# Patient Record
Sex: Female | Born: 1955
Health system: Southern US, Community
[De-identification: ages and names within clinical notes are randomized; demographics above are authoritative.]

## PROBLEM LIST (undated history)

## (undated) DIAGNOSIS — E785 Hyperlipidemia, unspecified: Secondary | ICD-10-CM

## (undated) DIAGNOSIS — I10 Essential (primary) hypertension: Secondary | ICD-10-CM

## (undated) DIAGNOSIS — E119 Type 2 diabetes mellitus without complications: Secondary | ICD-10-CM

## (undated) DIAGNOSIS — G43909 Migraine, unspecified, not intractable, without status migrainosus: Secondary | ICD-10-CM

## (undated) DIAGNOSIS — D649 Anemia, unspecified: Secondary | ICD-10-CM

## (undated) HISTORY — DX: Anemia, unspecified: D64.9

## (undated) HISTORY — DX: Essential (primary) hypertension: I10

## (undated) HISTORY — DX: Hyperlipidemia, unspecified: E78.5

## (undated) HISTORY — DX: Type 2 diabetes mellitus without complications: E11.9

## (undated) HISTORY — DX: Migraine, unspecified, not intractable, without status migrainosus: G43.909

## (undated) HISTORY — PX: OTHER SURGICAL HISTORY: SHX169

---

## 1998-10-30 ENCOUNTER — Ambulatory Visit (HOSPITAL_COMMUNITY): Admission: RE | Admit: 1998-10-30 | Discharge: 1998-10-30 | Payer: Self-pay | Admitting: Obstetrics and Gynecology

## 1998-10-30 ENCOUNTER — Encounter: Payer: Self-pay | Admitting: Obstetrics and Gynecology

## 1999-11-03 ENCOUNTER — Other Ambulatory Visit: Admission: RE | Admit: 1999-11-03 | Discharge: 1999-11-03 | Payer: Self-pay | Admitting: Obstetrics & Gynecology

## 1999-11-13 ENCOUNTER — Encounter: Payer: Self-pay | Admitting: Obstetrics and Gynecology

## 1999-11-13 ENCOUNTER — Ambulatory Visit (HOSPITAL_COMMUNITY): Admission: RE | Admit: 1999-11-13 | Discharge: 1999-11-13 | Payer: Self-pay | Admitting: Obstetrics and Gynecology

## 2001-05-23 ENCOUNTER — Encounter: Payer: Self-pay | Admitting: Obstetrics and Gynecology

## 2001-05-23 ENCOUNTER — Ambulatory Visit (HOSPITAL_COMMUNITY): Admission: RE | Admit: 2001-05-23 | Discharge: 2001-05-23 | Payer: Self-pay | Admitting: Obstetrics and Gynecology

## 2001-06-28 ENCOUNTER — Other Ambulatory Visit: Admission: RE | Admit: 2001-06-28 | Discharge: 2001-06-28 | Payer: Self-pay | Admitting: Obstetrics and Gynecology

## 2002-07-11 ENCOUNTER — Other Ambulatory Visit: Admission: RE | Admit: 2002-07-11 | Discharge: 2002-07-11 | Payer: Self-pay | Admitting: Obstetrics and Gynecology

## 2002-07-20 ENCOUNTER — Ambulatory Visit (HOSPITAL_COMMUNITY): Admission: RE | Admit: 2002-07-20 | Discharge: 2002-07-20 | Payer: Self-pay | Admitting: Obstetrics and Gynecology

## 2002-07-20 ENCOUNTER — Encounter: Payer: Self-pay | Admitting: Obstetrics and Gynecology

## 2003-03-13 ENCOUNTER — Encounter: Admission: RE | Admit: 2003-03-13 | Discharge: 2003-03-13 | Payer: Self-pay | Admitting: Internal Medicine

## 2003-03-13 ENCOUNTER — Encounter: Payer: Self-pay | Admitting: Internal Medicine

## 2003-04-29 ENCOUNTER — Ambulatory Visit (HOSPITAL_COMMUNITY): Admission: RE | Admit: 2003-04-29 | Discharge: 2003-04-29 | Payer: Self-pay | Admitting: Gastroenterology

## 2003-07-23 ENCOUNTER — Other Ambulatory Visit: Admission: RE | Admit: 2003-07-23 | Discharge: 2003-07-23 | Payer: Self-pay | Admitting: Obstetrics and Gynecology

## 2003-07-30 ENCOUNTER — Encounter: Payer: Self-pay | Admitting: Obstetrics and Gynecology

## 2003-07-30 ENCOUNTER — Ambulatory Visit (HOSPITAL_COMMUNITY): Admission: RE | Admit: 2003-07-30 | Discharge: 2003-07-30 | Payer: Self-pay | Admitting: Obstetrics and Gynecology

## 2004-07-31 ENCOUNTER — Other Ambulatory Visit: Admission: RE | Admit: 2004-07-31 | Discharge: 2004-07-31 | Payer: Self-pay | Admitting: Obstetrics and Gynecology

## 2004-07-31 ENCOUNTER — Ambulatory Visit (HOSPITAL_COMMUNITY): Admission: RE | Admit: 2004-07-31 | Discharge: 2004-07-31 | Payer: Self-pay | Admitting: Obstetrics and Gynecology

## 2005-02-19 ENCOUNTER — Ambulatory Visit: Payer: Self-pay | Admitting: Licensed Clinical Social Worker

## 2005-02-26 ENCOUNTER — Ambulatory Visit: Payer: Self-pay | Admitting: Licensed Clinical Social Worker

## 2005-03-05 ENCOUNTER — Ambulatory Visit: Payer: Self-pay | Admitting: Licensed Clinical Social Worker

## 2005-03-19 ENCOUNTER — Ambulatory Visit: Payer: Self-pay | Admitting: Licensed Clinical Social Worker

## 2005-04-09 ENCOUNTER — Ambulatory Visit: Payer: Self-pay | Admitting: Licensed Clinical Social Worker

## 2005-04-30 ENCOUNTER — Ambulatory Visit: Payer: Self-pay | Admitting: Licensed Clinical Social Worker

## 2005-06-11 ENCOUNTER — Ambulatory Visit: Payer: Self-pay | Admitting: Licensed Clinical Social Worker

## 2005-08-03 ENCOUNTER — Ambulatory Visit (HOSPITAL_COMMUNITY): Admission: RE | Admit: 2005-08-03 | Discharge: 2005-08-03 | Payer: Self-pay | Admitting: Obstetrics and Gynecology

## 2005-08-12 ENCOUNTER — Other Ambulatory Visit: Admission: RE | Admit: 2005-08-12 | Discharge: 2005-08-12 | Payer: Self-pay | Admitting: Obstetrics and Gynecology

## 2006-08-05 ENCOUNTER — Ambulatory Visit (HOSPITAL_COMMUNITY): Admission: RE | Admit: 2006-08-05 | Discharge: 2006-08-05 | Payer: Self-pay | Admitting: Obstetrics and Gynecology

## 2006-10-05 ENCOUNTER — Other Ambulatory Visit: Admission: RE | Admit: 2006-10-05 | Discharge: 2006-10-05 | Payer: Self-pay | Admitting: Obstetrics and Gynecology

## 2007-07-07 ENCOUNTER — Emergency Department (HOSPITAL_COMMUNITY): Admission: EM | Admit: 2007-07-07 | Discharge: 2007-07-07 | Payer: Self-pay | Admitting: Family Medicine

## 2007-08-10 ENCOUNTER — Ambulatory Visit (HOSPITAL_COMMUNITY): Admission: RE | Admit: 2007-08-10 | Discharge: 2007-08-10 | Payer: Self-pay | Admitting: Obstetrics and Gynecology

## 2007-08-16 ENCOUNTER — Encounter: Admission: RE | Admit: 2007-08-16 | Discharge: 2007-08-16 | Payer: Self-pay | Admitting: Obstetrics and Gynecology

## 2007-12-22 ENCOUNTER — Encounter: Admission: RE | Admit: 2007-12-22 | Discharge: 2007-12-22 | Payer: Self-pay | Admitting: Internal Medicine

## 2008-08-20 ENCOUNTER — Ambulatory Visit (HOSPITAL_COMMUNITY): Admission: RE | Admit: 2008-08-20 | Discharge: 2008-08-20 | Payer: Self-pay | Admitting: Obstetrics and Gynecology

## 2008-09-18 ENCOUNTER — Ambulatory Visit: Payer: Self-pay | Admitting: Family Medicine

## 2008-09-18 ENCOUNTER — Other Ambulatory Visit: Admission: RE | Admit: 2008-09-18 | Discharge: 2008-09-18 | Payer: Self-pay | Admitting: Family Medicine

## 2008-09-18 ENCOUNTER — Encounter: Payer: Self-pay | Admitting: Family Medicine

## 2008-09-18 DIAGNOSIS — I10 Essential (primary) hypertension: Secondary | ICD-10-CM | POA: Insufficient documentation

## 2008-09-19 ENCOUNTER — Telehealth (INDEPENDENT_AMBULATORY_CARE_PROVIDER_SITE_OTHER): Payer: Self-pay | Admitting: *Deleted

## 2008-09-19 LAB — CONVERTED CEMR LAB
ALT: 31 units/L (ref 0–35)
AST: 29 units/L (ref 0–37)
Albumin: 4 g/dL (ref 3.5–5.2)
Alkaline Phosphatase: 58 units/L (ref 39–117)
BUN: 12 mg/dL (ref 6–23)
Basophils Absolute: 0 10*3/uL (ref 0.0–0.1)
Basophils Relative: 0.7 % (ref 0.0–3.0)
Chloride: 100 meq/L (ref 96–112)
Cholesterol: 270 mg/dL (ref 0–200)
Creatinine, Ser: 0.7 mg/dL (ref 0.4–1.2)
Direct LDL: 136.5 mg/dL
Glucose, Bld: 82 mg/dL (ref 70–99)
MCV: 66.9 fL — ABNORMAL LOW (ref 78.0–100.0)
Monocytes Absolute: 0.6 10*3/uL (ref 0.1–1.0)
Total Bilirubin: 0.7 mg/dL (ref 0.3–1.2)
Triglycerides: 312 mg/dL (ref 0–149)

## 2008-10-17 ENCOUNTER — Ambulatory Visit: Payer: Self-pay | Admitting: Family Medicine

## 2008-10-18 ENCOUNTER — Encounter (INDEPENDENT_AMBULATORY_CARE_PROVIDER_SITE_OTHER): Payer: Self-pay | Admitting: *Deleted

## 2008-10-18 LAB — CONVERTED CEMR LAB
ALT: 32 units/L (ref 0–35)
AST: 28 units/L (ref 0–37)
Alkaline Phosphatase: 58 units/L (ref 39–117)
Bilirubin, Direct: 0.1 mg/dL (ref 0.0–0.3)
Total Bilirubin: 0.6 mg/dL (ref 0.3–1.2)

## 2008-12-19 ENCOUNTER — Ambulatory Visit: Payer: Self-pay | Admitting: Family Medicine

## 2009-01-15 ENCOUNTER — Telehealth (INDEPENDENT_AMBULATORY_CARE_PROVIDER_SITE_OTHER): Payer: Self-pay | Admitting: *Deleted

## 2009-06-24 ENCOUNTER — Ambulatory Visit: Payer: Self-pay | Admitting: Family Medicine

## 2009-06-24 DIAGNOSIS — E785 Hyperlipidemia, unspecified: Secondary | ICD-10-CM | POA: Insufficient documentation

## 2009-06-26 ENCOUNTER — Telehealth (INDEPENDENT_AMBULATORY_CARE_PROVIDER_SITE_OTHER): Payer: Self-pay | Admitting: *Deleted

## 2009-06-26 LAB — CONVERTED CEMR LAB
BUN: 14 mg/dL (ref 6–23)
CO2: 30 meq/L (ref 19–32)
HDL: 48.1 mg/dL (ref >39.00)
Potassium: 3.2 meq/L — ABNORMAL LOW (ref 3.5–5.1)
Total Bilirubin: 0.6 mg/dL (ref 0.3–1.2)
Total CHOL/HDL Ratio: 5

## 2009-06-27 ENCOUNTER — Ambulatory Visit: Payer: Self-pay | Admitting: Family Medicine

## 2009-06-27 DIAGNOSIS — F329 Major depressive disorder, single episode, unspecified: Secondary | ICD-10-CM

## 2009-06-27 DIAGNOSIS — F3289 Other specified depressive episodes: Secondary | ICD-10-CM | POA: Insufficient documentation

## 2009-07-29 ENCOUNTER — Ambulatory Visit: Payer: Self-pay | Admitting: Family Medicine

## 2009-08-05 ENCOUNTER — Encounter: Payer: Self-pay | Admitting: Family Medicine

## 2009-08-12 ENCOUNTER — Ambulatory Visit: Payer: Self-pay | Admitting: Internal Medicine

## 2009-08-12 ENCOUNTER — Ambulatory Visit: Payer: Self-pay | Admitting: Family Medicine

## 2009-08-12 DIAGNOSIS — E876 Hypokalemia: Secondary | ICD-10-CM | POA: Insufficient documentation

## 2009-08-13 LAB — CONVERTED CEMR LAB
CO2: 31 meq/L (ref 19–32)
Calcium: 9.7 mg/dL (ref 8.4–10.5)
Creatinine, Ser: 0.8 mg/dL (ref 0.4–1.2)
GFR calc non Af Amer: 79.48 mL/min (ref >60)
Glucose, Bld: 125 mg/dL — ABNORMAL HIGH (ref 70–99)

## 2009-08-14 ENCOUNTER — Telehealth (INDEPENDENT_AMBULATORY_CARE_PROVIDER_SITE_OTHER): Payer: Self-pay | Admitting: *Deleted

## 2009-08-20 ENCOUNTER — Ambulatory Visit: Payer: Self-pay | Admitting: Family Medicine

## 2009-08-20 DIAGNOSIS — J309 Allergic rhinitis, unspecified: Secondary | ICD-10-CM | POA: Insufficient documentation

## 2009-08-26 ENCOUNTER — Ambulatory Visit (HOSPITAL_COMMUNITY): Admission: RE | Admit: 2009-08-26 | Discharge: 2009-08-26 | Payer: Self-pay | Admitting: Family Medicine

## 2009-09-16 ENCOUNTER — Ambulatory Visit: Payer: Self-pay | Admitting: Family Medicine

## 2009-09-24 LAB — CONVERTED CEMR LAB: Pap Smear: NORMAL

## 2009-10-03 ENCOUNTER — Ambulatory Visit: Payer: Self-pay

## 2009-10-03 ENCOUNTER — Ambulatory Visit: Payer: Self-pay | Admitting: Family Medicine

## 2009-10-03 DIAGNOSIS — I8000 Phlebitis and thrombophlebitis of superficial vessels of unspecified lower extremity: Secondary | ICD-10-CM | POA: Insufficient documentation

## 2009-10-03 DIAGNOSIS — R609 Edema, unspecified: Secondary | ICD-10-CM | POA: Insufficient documentation

## 2009-11-26 ENCOUNTER — Encounter: Payer: Self-pay | Admitting: Family Medicine

## 2009-11-26 ENCOUNTER — Ambulatory Visit: Payer: Self-pay | Admitting: Family Medicine

## 2009-11-26 ENCOUNTER — Other Ambulatory Visit: Admission: RE | Admit: 2009-11-26 | Discharge: 2009-11-26 | Payer: Self-pay | Admitting: Family Medicine

## 2009-11-28 ENCOUNTER — Telehealth: Payer: Self-pay | Admitting: Family Medicine

## 2009-11-28 LAB — CONVERTED CEMR LAB
Albumin: 4.1 g/dL (ref 3.5–5.2)
Bilirubin, Direct: 0 mg/dL (ref 0.0–0.3)
CO2: 33 meq/L — ABNORMAL HIGH (ref 19–32)
Calcium: 9.7 mg/dL (ref 8.4–10.5)
Chloride: 103 meq/L (ref 96–112)
Cholesterol: 268 mg/dL — ABNORMAL HIGH (ref 0–200)
Eosinophils Absolute: 0.1 10*3/uL (ref 0.0–0.7)
GFR calc non Af Amer: 79.39 mL/min (ref >60)
Glucose, Bld: 84 mg/dL (ref 70–99)
HCT: 38 % (ref 36.0–46.0)
HDL: 54.4 mg/dL (ref >39.00)
Hemoglobin: 12.4 g/dL (ref 12.0–15.0)
Lymphocytes Relative: 22.5 % (ref 12.0–46.0)
Lymphs Abs: 1.3 10*3/uL (ref 0.7–4.0)
MCHC: 32.6 g/dL (ref 30.0–36.0)
MCV: 66.9 fL — ABNORMAL LOW (ref 78.0–100.0)
Neutro Abs: 3.9 10*3/uL (ref 1.4–7.7)
Platelets: 245 10*3/uL (ref 150.0–400.0)
Sodium: 142 meq/L (ref 135–145)
Total CHOL/HDL Ratio: 5

## 2009-12-04 ENCOUNTER — Ambulatory Visit: Payer: Self-pay | Admitting: Family Medicine

## 2009-12-04 ENCOUNTER — Encounter (INDEPENDENT_AMBULATORY_CARE_PROVIDER_SITE_OTHER): Payer: Self-pay | Admitting: *Deleted

## 2009-12-04 LAB — CONVERTED CEMR LAB: OCCULT 2: NEGATIVE

## 2009-12-30 ENCOUNTER — Telehealth (INDEPENDENT_AMBULATORY_CARE_PROVIDER_SITE_OTHER): Payer: Self-pay | Admitting: *Deleted

## 2010-04-08 ENCOUNTER — Ambulatory Visit: Payer: Self-pay | Admitting: Family Medicine

## 2010-04-15 ENCOUNTER — Ambulatory Visit: Payer: Self-pay | Admitting: Family Medicine

## 2010-04-16 ENCOUNTER — Telehealth (INDEPENDENT_AMBULATORY_CARE_PROVIDER_SITE_OTHER): Payer: Self-pay | Admitting: *Deleted

## 2010-04-16 LAB — CONVERTED CEMR LAB
AST: 21 units/L (ref 0–37)
Albumin: 4.2 g/dL (ref 3.5–5.2)
CO2: 32 meq/L (ref 19–32)
Cholesterol: 287 mg/dL — ABNORMAL HIGH (ref 0–200)
HDL: 46.5 mg/dL (ref >39.00)
Potassium: 4.3 meq/L (ref 3.5–5.1)
Total Bilirubin: 0.6 mg/dL (ref 0.3–1.2)
Total CHOL/HDL Ratio: 6
Triglycerides: 333 mg/dL — ABNORMAL HIGH (ref 0.0–149.0)
VLDL: 66.6 mg/dL — ABNORMAL HIGH (ref 0.0–40.0)

## 2010-05-04 ENCOUNTER — Ambulatory Visit: Payer: Self-pay | Admitting: Family Medicine

## 2010-08-25 ENCOUNTER — Ambulatory Visit: Payer: Self-pay | Admitting: Family Medicine

## 2010-08-25 LAB — CONVERTED CEMR LAB: Rapid Strep: NEGATIVE

## 2010-08-27 ENCOUNTER — Ambulatory Visit (HOSPITAL_COMMUNITY): Admission: RE | Admit: 2010-08-27 | Discharge: 2010-08-27 | Payer: Self-pay | Admitting: Family Medicine

## 2010-11-27 ENCOUNTER — Ambulatory Visit: Payer: Self-pay | Admitting: Family Medicine

## 2010-11-27 ENCOUNTER — Encounter: Payer: Self-pay | Admitting: Family Medicine

## 2010-11-27 ENCOUNTER — Other Ambulatory Visit
Admission: RE | Admit: 2010-11-27 | Discharge: 2010-11-27 | Payer: Self-pay | Source: Home / Self Care | Admitting: Family Medicine

## 2010-11-27 LAB — HM PAP SMEAR: HM Pap smear: NORMAL

## 2010-11-30 ENCOUNTER — Telehealth (INDEPENDENT_AMBULATORY_CARE_PROVIDER_SITE_OTHER): Payer: Self-pay | Admitting: *Deleted

## 2010-11-30 LAB — CONVERTED CEMR LAB
Alkaline Phosphatase: 89 units/L (ref 39–117)
BUN: 11 mg/dL (ref 6–23)
Calcium: 9.6 mg/dL (ref 8.4–10.5)
Chloride: 101 meq/L (ref 96–112)
Creatinine, Ser: 0.8 mg/dL (ref 0.4–1.2)
Direct LDL: 130.1 mg/dL
Eosinophils Absolute: 0.1 10*3/uL (ref 0.0–0.7)
Eosinophils Relative: 1.3 % (ref 0.0–5.0)
Glucose, Bld: 93 mg/dL (ref 70–99)
HDL: 55 mg/dL (ref >39.00)
Hemoglobin: 12.3 g/dL (ref 12.0–15.0)
Lymphocytes Relative: 22.5 % (ref 12.0–46.0)
Lymphs Abs: 1.2 10*3/uL (ref 0.7–4.0)
MCHC: 31.6 g/dL (ref 30.0–36.0)
MCV: 67.4 fL — ABNORMAL LOW (ref 78.0–100.0)
Monocytes Absolute: 0.4 10*3/uL (ref 0.1–1.0)
Monocytes Relative: 8 % (ref 3.0–12.0)
Neutro Abs: 3.6 10*3/uL (ref 1.4–7.7)
Platelets: 283 10*3/uL (ref 150.0–400.0)
Potassium: 4.2 meq/L (ref 3.5–5.1)
Total Bilirubin: 0.8 mg/dL (ref 0.3–1.2)
Total CHOL/HDL Ratio: 5
Triglycerides: 505 mg/dL — ABNORMAL HIGH (ref 0.0–149.0)
WBC: 5.3 10*3/uL (ref 4.5–10.5)

## 2011-01-12 NOTE — Progress Notes (Signed)
Summary: labs  Phone Note Outgoing Call   Call placed by: Doristine Devoid,  Apr 16, 2010 9:48 AM Call placed to: Patient Summary of Call: total cholesterol and LDL worse than previous.  pt is intolerant to statins- needs to start Welchol 3 tabs by mouth two times a day.  should drink full glass of water with meds.  may cause constipation- should start stool softener.  this medicine is not absorbed and does not cause muscle aches.  please provide pt samples.  needs f/u OV in 1 month.  should also focus on healthy diet and regular exercise.  rest of labs look good.  Follow-up for Phone Call        left message on machine .......Marland KitchenDoristine Devoid  Apr 16, 2010 9:49 AM   left message on machine .....Marland KitchenMarland KitchenDoristine Devoid  Apr 17, 2010 4:15 PM   spoke w/ patient aware of labs and that medication needs to be started due to excessively high cholesterol provide samples and copy of labs...Marland KitchenMarland KitchenDoristine Devoid  Apr 17, 2010 4:24 PM     New/Updated Medications: WELCHOL 625 MG TABS (COLESEVELAM HCL) take 3 tablets two times a day

## 2011-01-12 NOTE — Progress Notes (Signed)
Summary: Labs mailed to her  Phone Note Call from Patient   Summary of Call: Pt called and would like a copy of her labs mailed to her. I mailed her a copy. Army Fossa CMA  December 30, 2009 10:56 AM

## 2011-01-12 NOTE — Assessment & Plan Note (Signed)
Summary: 3WEEK FOLLOW UP/RH.....   Vital Signs:  Patient profile:   55 year old female Weight:      138 pounds Pulse rate:   67 / minute BP sitting:   124 / 90  (left arm)  Vitals Entered By: Doristine Devoid (May 04, 2010 4:13 PM) CC: 3 week f/u on BP   History of Present Illness: 55 yo woman here today for f/u on  1) BP- no CP, SOB, HAs, visual changes, edema.  tolerating meds w/out difficulty  2) Hyperlipidemia- doing well on Welchol.  + constipation.  eating fiber and using stool softener which is helping w/ constipation.  Current Medications (verified): 1)  Toprol Xl 50 Mg Xr24h-Tab (Metoprolol Succinate) .... Take One Tablet Daily 2)  Aspirin 81 Mg Tbec (Aspirin) .... Take One Tablet Daily 3)  Norvasc 5 Mg Tabs (Amlodipine Besylate) .Marland Kitchen.. 1 By Mouth Once Daily 4)  Hydrochlorothiazide 12.5 Mg  Tabs (Hydrochlorothiazide) .... Take 1 Tab  By Mouth Every Morning 5)  Welchol 625 Mg Tabs (Colesevelam Hcl) .... Take 3 Tablets Two Times A Day  Allergies (verified): 1)  ! * Statins  Review of Systems      See HPI  Physical Exam  General:  Well-developed,well-nourished,in no acute distress; alert,appropriate and cooperative throughout examination Neck:  No deformities, masses, or tenderness noted. Lungs:  Normal respiratory effort, chest expands symmetrically. Lungs are clear to auscultation, no crackles or wheezes. Heart:  normal rate and no murmur.   Pulses:  +2 carotid, radial, DP pulses Extremities:  No clubbing, cyanosis, edema, or deformity noted.     Impression & Recommendations:  Problem # 1:  HYPERTENSION, BENIGN ESSENTIAL (ICD-401.1) Assessment Unchanged BP adequately controlled.  asymptomatic.  no changes. Her updated medication list for this problem includes:    Toprol Xl 50 Mg Xr24h-tab (Metoprolol succinate) .Marland Kitchen... Take one tablet daily    Norvasc 5 Mg Tabs (Amlodipine besylate) .Marland Kitchen... 1 by mouth once daily    Hydrochlorothiazide 12.5 Mg Tabs  (Hydrochlorothiazide) .Marland Kitchen... Take 1 tab  by mouth every morning  Problem # 2:  HYPERLIPIDEMIA (ICD-272.4) Assessment: Unchanged tolerating welchol w/out muscle aches of a statin.  continue. Her updated medication list for this problem includes:    Welchol 625 Mg Tabs (Colesevelam hcl) .Marland Kitchen... Take 3 tablets two times a day  Complete Medication List: 1)  Toprol Xl 50 Mg Xr24h-tab (Metoprolol succinate) .... Take one tablet daily 2)  Aspirin 81 Mg Tbec (Aspirin) .... Take one tablet daily 3)  Norvasc 5 Mg Tabs (Amlodipine besylate) .Marland Kitchen.. 1 by mouth once daily 4)  Hydrochlorothiazide 12.5 Mg Tabs (Hydrochlorothiazide) .... Take 1 tab  by mouth every morning 5)  Welchol 625 Mg Tabs (Colesevelam hcl) .... Take 3 tablets two times a day  Patient Instructions: 1)  Please schedule your physical after 12/15- do not eat before this appt 2)  Continue your meds as directed- they are working! 3)  Call for refills 4)  Have a great summer!!! Prescriptions: WELCHOL 625 MG TABS (COLESEVELAM HCL) take 3 tablets two times a day  #180 x 3   Entered and Authorized by:   Neena Rhymes MD   Signed by:   Neena Rhymes MD on 05/04/2010   Method used:   [facility]        [facility] [phone][facility] (retail)       [address]       [place], [place]  [address]  Ph: [phone]       Fax: [phone]   RxID:   [id number]

## 2011-01-12 NOTE — Assessment & Plan Note (Signed)
Summary: sore throat/cbs   Vital Signs:  Patient profile:   55 year old female Height:      58.75 inches Weight:      141 pounds O2 Sat:      99 % on Room air Temp:     97.9 degrees F oral Pulse rate:   79 / minute BP sitting:   120 / 80  (left arm)  Vitals Entered By: Jeremy Johann CMA (August 25, 2010 11:53 AM)  O2 Flow:  Room air CC: sore throat, dry cough x1d   History of Present Illness: 55 yo woman here today for sore throat.  sxs started yesterday.  had temp of 99.5 at work earlier today.  + fatigue.  + L ear pain.  no nasal congestion.  + cough w/ talking, 'it's irritated'.  no facial pain or pressure.  + sick contacts.  Current Medications (verified): 1)  Toprol Xl 50 Mg Xr24h-Tab (Metoprolol Succinate) .... Take One Tablet Daily 2)  Aspirin 81 Mg Tbec (Aspirin) .... Take One Tablet Daily 3)  Norvasc 5 Mg Tabs (Amlodipine Besylate) .Marland Kitchen.. 1 By Mouth Once Daily 4)  Hydrochlorothiazide 12.5 Mg  Tabs (Hydrochlorothiazide) .... Take 1 Tab  By Mouth Every Morning 5)  Welchol 625 Mg Tabs (Colesevelam Hcl) .... Take 3 Tablets Two Times A Day  Allergies (verified): 1)  ! * Statins  Review of Systems      See HPI  Physical Exam  General:  Well-developed,well-nourished,in no acute distress; alert,appropriate and cooperative throughout examination Head:  Normocephalic and atraumatic without obvious abnormalities. No apparent alopecia or balding.  no TTP over sinuses Eyes:  no injxn or inflammation Ears:  L TM erythematous, dull, poor landmarks R TM WNL Nose:  mild congestion and turbinate edema Mouth:  + PND and pharyngeal erythema.  no tonsilar enlargement or exudate Neck:  No deformities, masses, or tenderness noted. Lungs:  Normal respiratory effort, chest expands symmetrically. Lungs are clear to auscultation, no crackles or wheezes. Heart:  normal rate and no murmur.     Impression & Recommendations:  Problem # 1:  OTITIS MEDIA (ICD-382.9) Assessment  New  pt w/ LOM.  likely cause of sore throat.  start amox.  reviewed supportive care and red flags that should prompt return.  Pt expresses understanding and is in agreement w/ this plan. Her updated medication list for this problem includes:    Aspirin 81 Mg Tbec (Aspirin) .Marland Kitchen... Take one tablet daily    Amoxicillin 500 Mg Tabs (Amoxicillin) .Marland Kitchen... 1 tab by mouth two times a day x10 days for ear infection  Orders: Rapid Strep ([address])  Complete Medication List: 1)  Toprol Xl 50 Mg Xr24h-tab (Metoprolol succinate) .... Take one tablet daily 2)  Aspirin 81 Mg Tbec (Aspirin) .... Take one tablet daily 3)  Norvasc 5 Mg Tabs (Amlodipine besylate) .Marland Kitchen.. 1 by mouth once daily 4)  Hydrochlorothiazide 12.5 Mg Tabs (Hydrochlorothiazide) .... Take 1 tab  by mouth every morning 5)  Welchol 625 Mg Tabs (Colesevelam hcl) .... Take 3 tablets two times a day 6)  Amoxicillin 500 Mg Tabs (Amoxicillin) .Marland Kitchen.. 1 tab by mouth two times a day x10 days for ear infection  Patient Instructions: 1)  Take the Amoxicillin as directed for the ear infection- take w/ food to avoid upset stomach 2)  Drink plenty of fluids 3)  Tylenol/Ibuprofen as needed for fever or pain 4)  Try Claritin or Zyrtec over the counter if you develop allergy symptoms 5)  Call with  any questions or concerns 6)  Hang in there! Prescriptions: AMOXICILLIN 500 MG TABS (AMOXICILLIN) 1 tab by mouth two times a day x10 days for ear infection  #20 x 0   Entered and Authorized by:   Neena Rhymes MD   Signed by:   Neena Rhymes MD on 08/25/2010   Method used:   [facility] to        [facility] [phone][facility] (retail)       [address]       [place], [place]  [address]       Ph: [phone]       Fax: [phone]   RxID:   [id number]   Laboratory Results    Wet [place]/KOH  Other Tests  Rapid Strep: negative  Kit Test Internal QC: Positive   (Normal Range: Negative)  Appended Document: Orders  Update    Clinical Lists Changes  Observations: Added new observation of RAPID STREP: negative (08/25/2010 14:36)      Laboratory Results    Other Tests  Rapid Strep: negative

## 2011-01-12 NOTE — Assessment & Plan Note (Signed)
Summary: roa//check cholesterol//lch   Vital Signs:  Patient profile:   55 year old female Height:      58.75 inches Weight:      139.38 pounds BMI:     28.49 Pulse rate:   64 / minute BP sitting:   130 / 90 CC: followup on cholesterol. pt d/c fish oil a week ago (burping), d/c lipitor took only 1 month (legs hurt)   History of Present Illness: 55 yo woman here today for f/u  1) HTN- feels she is retaining fluid at the end of her day.  restarted HCTZ w/ good results- decreased bloating, leg swelling.  improved energy.  taking Toprol and Norvasc w/out problems.  denies CP, SOB, HAs, visual changes.  2) Cholesterol- pt is convinced that statins cause her R knee to hurt.  stopped the lipitor after 1 month due to leg pain.  stopped the fish oil last week due to GI side effects and belching.  has been taking Cholestoff for 1 year.  due to lab recheck but pt not fasting.  Current Medications (verified): 1)  Toprol Xl 50 Mg Xr24h-Tab (Metoprolol Succinate) .... Take One Tablet Daily 2)  Aspirin 81 Mg Tbec (Aspirin) .... Take One Tablet Daily 3)  Norvasc 5 Mg Tabs (Amlodipine Besylate) .Marland Kitchen.. 1 By Mouth Once Daily 4)  Hydrochlorothiazide 12.5 Mg  Tabs (Hydrochlorothiazide) .... Take 1 Tab  By Mouth Every Morning  Allergies (verified): 1)  ! * Statins  Past History:  Past Medical History: Last updated: 12/19/2008 HTN + TB test Colon polyps- follows w/ Dr Loreta Ave Migraines Anemia Hyperlipidemia  Review of Systems      See HPI  Physical Exam  General:  Well-developed,well-nourished,in no acute distress; alert,appropriate and cooperative throughout examination Neck:  No deformities, masses, or tenderness noted. Lungs:  Normal respiratory effort, chest expands symmetrically. Lungs are clear to auscultation, no crackles or wheezes. Heart:  normal rate and no murmur.   Pulses:  +2 carotid, radial, DP pulses Extremities:  No clubbing, cyanosis, edema, or deformity noted.      Impression & Recommendations:  Problem # 1:  HYPERTENSION, BENIGN ESSENTIAL (ICD-401.1) Assessment Unchanged BP adequately controlled.  pt would like to restart HCTZ daily for fluid retention.  will start meds and check BMP when pt returns for lipids. Her updated medication list for this problem includes:    Toprol Xl 50 Mg Xr24h-tab (Metoprolol succinate) .Marland Kitchen... Take one tablet daily    Norvasc 5 Mg Tabs (Amlodipine besylate) .Marland Kitchen... 1 by mouth once daily    Hydrochlorothiazide 12.5 Mg Tabs (Hydrochlorothiazide) .Marland Kitchen... Take 1 tab  by mouth every morning  Problem # 2:  HYPERLIPIDEMIA (ICD-272.4) Assessment: Unchanged pt stopped lipitor due to knee pain.  pt has been on at least 3 statins and has stopped each due to side effects.  stopped fish oil due to GI intolerance and belching.  once i have pt's lab results pt will likely need to start Zetia or Welchol.  discussed this w/ pt.  will follow. The following medications were removed from the medication list:    Lipitor 10 Mg Tabs (Atorvastatin calcium) .Marland Kitchen... 1 by mouth every other day.  Complete Medication List: 1)  Toprol Xl 50 Mg Xr24h-tab (Metoprolol succinate) .... Take one tablet daily 2)  Aspirin 81 Mg Tbec (Aspirin) .... Take one tablet daily 3)  Norvasc 5 Mg Tabs (Amlodipine besylate) .Marland Kitchen.. 1 by mouth once daily 4)  Hydrochlorothiazide 12.5 Mg Tabs (Hydrochlorothiazide) .... Take 1 tab  by mouth  every morning  Patient Instructions: 1)  Schedule a lab visit- do not eat before this appt 2)  BMP prior to visit, ICD-9:  401.1 3)  Hepatic Panel prior to visit ICD-9: 272.4 4)  Lipid panel prior to visit ICD-9 : 272.4 5)  Start the HCTZ daily 6)  Continue the Toprol and the Norvasc 7)  Once we have your lab results we'll decide what the next step is 8)  Hang in there!! Prescriptions: HYDROCHLOROTHIAZIDE 12.5 MG  TABS (HYDROCHLOROTHIAZIDE) Take 1 tab  by mouth every morning  #30 x 3   Entered and Authorized by:   Neena Rhymes  MD   Signed by:   Neena Rhymes MD on 04/08/2010   Method used:   [facility]ically to        [facility] [phone]* (retail)       [address]       [place], [place]  [address]       Ph: [phone]       Fax: [phone]   RxID:   [id number]

## 2011-01-14 NOTE — Assessment & Plan Note (Signed)
Summary: CPX/ FASTING///SPH   Vital Signs:  Patient profile:   56 year old female Height:      58.50 inches Weight:      137 pounds BMI:     28.25 Pulse rate:   70 / minute BP sitting:   110 / 84  (left arm)  Vitals Entered By: Doristine Devoid CMA (November 27, 2010 8:08 AM) CC: CPX AND LABS W/ PAP   History of Present Illness: 55 yo woman here today for CPE.  UTD on mammogram- Oct 2011.  had colonoscopy 7 yrs ago- Dr Loreta Ave.  Preventive Screening-Counseling & Management  Alcohol-Tobacco     Alcohol drinks/day: 0     Smoking Status: current     Smoking Cessation Counseling: yes     Smoke Cessation Stage: contemplative     Packs/Day: 0.25  Caffeine-Diet-Exercise     Does Patient Exercise: no      Sexual History:  currently monogamous.        Drug Use:  never.    Current Medications (verified): 1)  Toprol Xl 50 Mg Xr24h-Tab (Metoprolol Succinate) .... Take One Tablet Daily 2)  Aspirin 81 Mg Tbec (Aspirin) .... Take One Tablet Daily 3)  Norvasc 5 Mg Tabs (Amlodipine Besylate) .Marland Kitchen.. 1 By Mouth Once Daily 4)  Hydrochlorothiazide 12.5 Mg  Tabs (Hydrochlorothiazide) .... Take 1 Tab  By Mouth Every Morning 5)  Welchol 625 Mg Tabs (Colesevelam Hcl) .... Take 3 Tablets Two Times A Day  Allergies (verified): 1)  ! * Statins  Past History:  Past medical, surgical, family and social histories (including risk factors) reviewed, and no changes noted (except as noted below).  Past Medical History: Reviewed history from 12/19/2008 and no changes required. HTN + TB test Colon polyps- follows w/ Dr Loreta Ave Migraines Anemia Hyperlipidemia  Past Surgical History: Reviewed history from 09/18/2008 and no changes required. None  Family History: Reviewed history from 09/18/2008 and no changes required. Hx of HTN, CAD, hyperlipidemia in parents Maternal uncle w/ colon cancer  Social History: Reviewed history from 09/18/2008 and no changes required. Married, has 1 biological child  (98), inherited sisters children when she passed away (01,02).  Smokes 5 cigs/day, rare ETOH, no drugs.  Works as Charity fundraiser at [employer] DeptPacks/Day:  0.25 Sexual History:  currently monogamous  Review of Systems  The patient denies anorexia, fever, weight loss, weight gain, vision loss, decreased hearing, hoarseness, chest pain, syncope, dyspnea on exertion, peripheral edema, prolonged cough, headaches, abdominal pain, melena, hematochezia, severe indigestion/heartburn, hematuria, suspicious skin lesions, depression, unusual weight change, abnormal bleeding, enlarged lymph nodes, and breast masses.    Physical Exam  General:  Well-developed,well-nourished,in no acute distress; alert,appropriate and cooperative throughout examination Head:  Normocephalic and atraumatic without obvious abnormalities. No apparent alopecia or balding. Eyes:  No corneal or conjunctival inflammation noted. EOMI. Perrla. Funduscopic exam benign, without hemorrhages, exudates or papilledema. Vision grossly normal. Ears:  External ear exam shows no significant lesions or deformities.  Otoscopic examination reveals clear canals, tympanic membranes are intact bilaterally without bulging, retraction, inflammation or discharge. Hearing is grossly normal bilaterally. Nose:  External nasal examination shows no deformity or inflammation. Nasal mucosa are pink and moist without lesions or exudates. Mouth:  Oral mucosa and oropharynx without lesions or exudates.  Teeth in good repair. Neck:  No deformities, masses, or tenderness noted. Lungs:  Normal respiratory effort, chest expands symmetrically. Lungs are clear to auscultation, no crackles or wheezes. Heart:  Normal rate and regular rhythm. S1 and S2 normal  without gallop, murmur, click, rub or other extra sounds. Abdomen:  Bowel sounds positive,abdomen soft and non-tender without masses, organomegaly or hernias noted. Genitalia:  Pelvic Exam:        External: normal female genitalia  without lesions or masses        Vagina: normal without lesions or masses        Cervix: normal without lesions or masses        Adnexa: normal bimanual exam without masses or fullness        Uterus: normal by palpation        Pap smear: performed Pulses:  +2 carotid, radial, DP pulses Extremities:  No clubbing, cyanosis, edema, or deformity noted.   Neurologic:  No cranial nerve deficits noted. Station and gait are normal. Plantar reflexes are down-going bilaterally. DTRs are symmetrical throughout. Sensory, motor and coordinative functions appear intact. Skin:  Intact without suspicious lesions or rashes Cervical Nodes:  No lymphadenopathy noted Axillary Nodes:  No palpable lymphadenopathy Psych:  Cognition and judgment appear intact. Alert and cooperative with normal attention span and concentration. No apparent delusions, illusions, hallucinations   Impression & Recommendations:  Problem # 1:  ROUTINE GYNECOLOGICAL EXAMINATION (ICD-V72.31) Assessment Unchanged pt's PE WNL.  UTD on mammo and colonoscopy.  anticipatory guidance provided. Orders: T-Vitamin D (25-Hydroxy) [phone]) Specimen Handling (40086)  Problem # 2:  SCREENING FOR MALIGNANT NEOPLASM OF THE CERVIX (ICD-V76.2) Assessment: Unchanged pap collected  Complete Medication List: 1)  Toprol Xl 50 Mg Xr24h-tab (Metoprolol succinate) .... Take one tablet daily 2)  Aspirin 81 Mg Tbec (Aspirin) .... Take one tablet daily 3)  Norvasc 5 Mg Tabs (Amlodipine besylate) .Marland Kitchen.. 1 by mouth once daily 4)  Hydrochlorothiazide 12.5 Mg Tabs (Hydrochlorothiazide) .... Take 1 tab  by mouth every morning 5)  Welchol 625 Mg Tabs (Colesevelam hcl) .... Take 3 tablets two times a day  Other Orders: Venipuncture (76195) TLB-BMP (Basic Metabolic Panel-BMET) (80048-METABOL) TLB-CBC Platelet - w/Differential (85025-CBCD) TLB-TSH (Thyroid Stimulating Hormone) (84443-TSH) TLB-Lipid Panel (80061-LIPID) TLB-Hepatic/Liver Function Pnl  (80076-HEPATIC)  Patient Instructions: 1)  Follow up in 6 months to recheck your cholesterol and blood pressure 2)  Your exam looks great!  Keep up the good work! 3)  We'll notify you of your labs 4)  Call with any questions or concerns 5)  Happy Holidays!!!   Orders Added: 1)  Venipuncture [36415] 2)  TLB-BMP (Basic Metabolic Panel-BMET) [80048-METABOL] 3)  TLB-CBC Platelet - w/Differential [85025-CBCD] 4)  TLB-TSH (Thyroid Stimulating Hormone) [84443-TSH] 5)  TLB-Lipid Panel [80061-LIPID] 6)  TLB-Hepatic/Liver Function Pnl [80076-HEPATIC] 7)  T-Vitamin D (25-Hydroxy) [09326-71245] 8)  Specimen Handling [99000] 9)  Est. Patient 40-64 years [80998]

## 2011-01-14 NOTE — Progress Notes (Signed)
Summary: labs  Phone Note Outgoing Call   Call placed by: Doristine Devoid CMA,  November 30, 2010 11:43 AM Call placed to: Patient Summary of Call: MCV is low- should start over the counter iron supplement and stool softener daily  cholesterol and triglycerides are very elevated.  needs to start fenofibrate 164 daily and recheck LFTs in 6-8 weeks  level is low.  needs to start Vit D 50,000 units weekly x12 weeks and then recheck  Follow-up for Phone Call        left message on machine ........Marland KitchenDoristine Devoid CMA  November 30, 2010 11:44 AM   Pt aware Rx sent to pharmacy............Marland KitchenFelecia Deloach CMA  December 03, 2010 11:48 AM     New/Updated Medications: FENOFIBRATE 160 MG TABS (FENOFIBRATE) Take 1 tab once daily VITAMIN D (ERGOCALCIFEROL) 50000 UNIT CAPS (ERGOCALCIFEROL) Take 1 tab once weekly Prescriptions: VITAMIN D (ERGOCALCIFEROL) 50000 UNIT CAPS (ERGOCALCIFEROL) Take 1 tab once weekly  #4 x 3   Entered by:   Jeremy Johann CMA   Authorized by:   Neena Rhymes MD   Signed by:   Jeremy Johann CMA on 12/03/2010   Method used:   Faxed to ...       [facility] [phone]* (retail)       [address]       [place], [place]  [address]       Ph: [phone]       Fax: [phone]   RxID:   [id number] FENOFIBRATE 160 MG TABS (FENOFIBRATE) Take 1 tab once daily  #30 x 2   Entered by:   Jeremy Johann CMA   Authorized by:   Neena Rhymes MD   Signed by:   Jeremy Johann CMA on 12/03/2010   Method used:   Faxed to ...       [facility] [phone]* (retail)       [address]       [place], [place]  [address]       Ph: [phone]       Fax: [phone]   RxID:   [id number]

## 2011-02-06 ENCOUNTER — Encounter: Payer: Self-pay | Admitting: Family Medicine

## 2011-03-03 ENCOUNTER — Other Ambulatory Visit: Payer: Self-pay | Admitting: Family Medicine

## 2011-03-03 NOTE — Telephone Encounter (Signed)
Ok to refill but please schedule pt for a lab appt to check LFTs.

## 2011-03-03 NOTE — Telephone Encounter (Signed)
Per [facility], pt appears to be due from labs (see 11/2010) labs. Please advise.

## 2011-03-04 MED ORDER — FENOFIBRATE 160 MG PO TABS: 160.0000 mg | ORAL_TABLET | Freq: Every day | ORAL | Status: DC

## 2011-03-04 NOTE — Telephone Encounter (Signed)
Pt has an upcoming appt, will fill med. Lucious Groves, CMA

## 2011-03-04 NOTE — Telephone Encounter (Signed)
Left message on machine at home to call the office.

## 2011-03-09 ENCOUNTER — Ambulatory Visit (INDEPENDENT_AMBULATORY_CARE_PROVIDER_SITE_OTHER): Payer: 59 | Admitting: Family Medicine

## 2011-03-09 ENCOUNTER — Encounter: Payer: Self-pay | Admitting: Family Medicine

## 2011-03-09 ENCOUNTER — Encounter: Payer: Self-pay | Admitting: *Deleted

## 2011-03-09 DIAGNOSIS — I10 Essential (primary) hypertension: Secondary | ICD-10-CM

## 2011-03-09 DIAGNOSIS — E785 Hyperlipidemia, unspecified: Secondary | ICD-10-CM

## 2011-03-09 DIAGNOSIS — E781 Pure hyperglyceridemia: Secondary | ICD-10-CM

## 2011-03-09 LAB — BASIC METABOLIC PANEL
BUN: 15 mg/dL (ref 6–23)
Calcium: 9.8 mg/dL (ref 8.4–10.5)
Creatinine, Ser: 0.8 mg/dL (ref 0.4–1.2)
GFR: 79.02 mL/min (ref >60.00)
Glucose, Bld: 89 mg/dL (ref 70–99)
Potassium: 3.8 mEq/L (ref 3.5–5.1)
Sodium: 142 mEq/L (ref 135–145)

## 2011-03-09 LAB — HEPATIC FUNCTION PANEL
Alkaline Phosphatase: 62 U/L (ref 39–117)
Bilirubin, Direct: 0 mg/dL (ref 0.0–0.3)
Total Protein: 7.2 g/dL (ref 6.0–8.3)

## 2011-03-09 LAB — LIPID PANEL
Triglycerides: 234 mg/dL — ABNORMAL HIGH (ref 0.0–149.0)
VLDL: 46.8 mg/dL — ABNORMAL HIGH (ref 0.0–40.0)

## 2011-03-09 LAB — LDL CHOLESTEROL, DIRECT: Direct LDL: 92.2 mg/dL

## 2011-03-09 MED ORDER — ROSUVASTATIN CALCIUM 10 MG PO TABS: 10.0000 mg | ORAL_TABLET | Freq: Every day | ORAL | Status: DC

## 2011-03-09 MED ORDER — TOPROL XL 50 MG PO TB24: 50.0000 mg | ORAL_TABLET | Freq: Every day | ORAL | Status: DC

## 2011-03-09 MED ORDER — NORVASC 5 MG PO TABS: 5.0000 mg | ORAL_TABLET | Freq: Every day | ORAL | Status: DC

## 2011-03-09 MED ORDER — FENOFIBRATE 160 MG PO TABS: 160.0000 mg | ORAL_TABLET | Freq: Every day | ORAL | Status: DC

## 2011-03-09 NOTE — Patient Instructions (Signed)
Follow up in June to recheck your blood pressure- sooner if you need me! We'll notify you of your lab results If you can't take the Crestor daily, take it every other day as you have been Call with any questions or concerns Happy Spring!

## 2011-03-09 NOTE — Assessment & Plan Note (Signed)
Slightly elevated today but pt admits to poor sleep and increased work stress.  No need to change meds at this time but will follow in 3 months to recheck.  Reviewed supportive care and red flags that should prompt return.  Pt expressed understanding and is in agreement w/ plan.

## 2011-03-09 NOTE — Assessment & Plan Note (Signed)
Labs were very high last visit.  Took fenofibrate for ~3 months but ran out last week.  Has added statin.  Will check labs today to assess improvement.

## 2011-03-09 NOTE — Progress Notes (Signed)
  Subjective:    Patient ID: Abigail Ellis, female    DOB: April 10, 1956, 55 y.o.   MRN: [id number]  HPI HTN- chronic problem.  Usually well controlled.  pt reports increased stress at work.  + HAs.  Had only 4hrs of sleep last night.  Took BP meds this AM.  No CP, SOB, visual changes, edema.  Hyperlipidemia- chronic problem, poorly controlled at last check.  taking Welchol daily.  Denies side effects.  No abd pain, N/V/D, constipation.  Has been taking Crestor 10mg  for 3 months every other day (taking mother's med).  No myalgias or muscle aches as she had w/ other statins.  Hypertriglyceridemia- pt reports that lab values from last visit 'scared' her.  Chronic problem, poorly controlled.  ran out of Fenofibrate last week.  Had some stomach upset w/ meds but this improved when taken w/ food.  No myalgias.   Review of Systems For ROS see HPI    Objective:   Physical Exam  Constitutional: She is oriented to person, place, and time. She appears well-developed and well-nourished. No distress.  HENT:  Head: Normocephalic and atraumatic.  Eyes: Conjunctivae and EOM are normal. Pupils are equal, round, and reactive to light.  Neck: Normal range of motion. Neck supple. No thyromegaly present.  Cardiovascular: Normal rate, regular rhythm, normal heart sounds and intact distal pulses.   No murmur heard. Pulmonary/Chest: Effort normal and breath sounds normal. No respiratory distress.  Abdominal: Soft. There is no tenderness. There is no rebound.  Lymphadenopathy:    She has no cervical adenopathy.  Neurological: She is alert and oriented to person, place, and time.  Skin: Skin is warm and dry.          Assessment & Plan:

## 2011-03-09 NOTE — Assessment & Plan Note (Signed)
Pt has been taking Welchol w/out difficulty.  Added mom's Crestor b/c lab values scared her in December.  Unlike when she has taken other statins she has not had any problems w/ myalgias.  Check labs and adjust meds prn.

## 2011-03-12 ENCOUNTER — Other Ambulatory Visit: Payer: Self-pay | Admitting: Family Medicine

## 2011-03-15 NOTE — Telephone Encounter (Signed)
Refused, refills were given on 03/09/11.

## 2011-03-26 ENCOUNTER — Other Ambulatory Visit: Payer: Self-pay | Admitting: Family Medicine

## 2011-03-29 MED ORDER — CHOLECALCIFEROL 25 MCG (1000 UT) PO CAPS: 1000.0000 [IU] | ORAL_CAPSULE | Freq: Every day | ORAL | Status: AC

## 2011-03-29 NOTE — Telephone Encounter (Signed)
Left message on voicemail to call the office

## 2011-03-29 NOTE — Telephone Encounter (Signed)
Stop the Vitamin D weekly and start a daily supplement of 1000 units.  Ok to refill the others.

## 2011-03-29 NOTE — Telephone Encounter (Signed)
Do you want the pt to continue this dose of Vitamin D? Please advise.

## 2011-03-29 NOTE — Telephone Encounter (Signed)
Pt.notified

## 2011-04-30 ENCOUNTER — Other Ambulatory Visit: Payer: Self-pay | Admitting: *Deleted

## 2011-04-30 DIAGNOSIS — I10 Essential (primary) hypertension: Secondary | ICD-10-CM

## 2011-04-30 MED ORDER — NORVASC 5 MG PO TABS: 5.0000 mg | ORAL_TABLET | Freq: Every day | ORAL | Status: DC

## 2011-04-30 NOTE — Op Note (Signed)
   Abigail Ellis, Abigail Ellis                              ACCOUNT NO.:  [id number]   MEDICAL RECORD NO.:  [id number]                   PATIENT TYPE:  AMB   LOCATION:  ENDO                                 FACILITY:  [facility]   PHYSICIAN:  Anselmo Rod, M.D.               DATE OF BIRTH:  02-13-1956   DATE OF PROCEDURE:  04/29/2003  DATE OF DISCHARGE:                                 OPERATIVE REPORT   PROCEDURE:  Esophagogastroduodenoscopy.   ENDOSCOPIST:  Anselmo Rod, M.D.   INSTRUMENT USED:  Olympus video panendoscope.   INDICATION FOR PROCEDURE:  A 55 year old Asian female with a history of iron-  deficiency anemia.  Rule out peptic ulcer disease, esophagitis, gastritis,  etc.   PREPROCEDURE PREPARATION:  Informed consent was procured from the patient.  The patient was fasted for eight hours prior to the procedure.   PREPROCEDURE PHYSICAL:  VITAL SIGNS:  The patient had stable vital signs.  NECK:  Supple.  CHEST:  Clear to auscultation.  S1, S2 regular.  ABDOMEN:  Soft with normal bowel sounds.   DESCRIPTION OF PROCEDURE:  The patient was placed in the left lateral  decubitus position and sedated with 70 mg of Demerol and 7 mg of Versed  intravenously.  Once the patient was adequately sedate and maintained on low-  flow oxygen and continuous cardiac monitoring, the Olympus video  panendoscope was advanced through the mouthpiece, over the tongue, into the  esophagus under direct vision.  The entire esophagus appeared normal with no  evidence of ring, stricture, masses, esophagitis, Barrett's mucosa.  The  scope was then advanced into the stomach.  The entire gastric mucosa and the  proximal small bowel appeared normal.  Retroflexion in the high cardia  revealed no abnormalities.   IMPRESSION:  Normal EGD.   RECOMMENDATIONS:  Proceed with a colonoscopy at this time.                                               Anselmo Rod, M.D.   JNM/MEDQ  D:  04/30/2003  T:  04/30/2003   Job:  [id number]   cc:   Olene Craven, M.D.  [address]  [address]  [place]  [place] [address]  Fax: [phone]

## 2011-04-30 NOTE — Telephone Encounter (Signed)
Sent one refill, pt due for follow up in June.

## 2011-04-30 NOTE — Op Note (Signed)
Abigail Ellis, BESKE                              ACCOUNT NO.:  [id number]   MEDICAL RECORD NO.:  [id number]                   PATIENT TYPE:  AMB   LOCATION:  ENDO                                 FACILITY:  [facility]   PHYSICIAN:  Anselmo Rod, M.D.               DATE OF BIRTH:  [birth year]   DATE OF PROCEDURE:  04/29/2003  DATE OF DISCHARGE:                                 OPERATIVE REPORT   PROCEDURE PERFORMED:  Colonoscopy.   ENDOSCOPIST:  Charna Elizabeth, M.D.   INSTRUMENT USED:  Olympus video colonoscope.   INDICATIONS FOR PROCEDURE:  The patient is a 55 year old Asian female  undergoing colonoscopy for iron deficiency anemia.  Rule out colonic polyps,  masses, hemorrhoids, etc.   PREPROCEDURE PREPARATION:  Informed consent was procured from the patient.  The patient was fasted for eight hours prior to the procedure and prepped  with a bottle of magnesium citrate and a gallon of GoLYTELY the night prior  to the procedure.   PREPROCEDURE PHYSICAL:  The patient had stable vital signs.  Neck supple.  Chest clear to auscultation.  S1 and S2 regular.  Abdomen soft with normal  bowel sounds.   DESCRIPTION OF PROCEDURE:  The patient was placed in left lateral decubitus  position and sedated with Demerol and Versed for the EGD.  No additional  sedation was used for the colonoscopy.  Once the patient was adequately  sedated and maintained on low flow oxygen and continuous cardiac monitoring,  the Olympus video colonoscope was advanced from the rectum to the cecum and  terminal ileum without difficulty.  A few scattered diverticula were seen  throughout the colon but no masses, polyps, erosions, ulcerations or  arteriovenous malformations were identified.  The patient tolerated the  procedure well without complications.  Retroflexion in the rectum revealed  no abnormality.   IMPRESSION:  Unrevealing colonoscopy except for a few scattered diverticula.  No source of bleeding  identified.   RECOMMENDATIONS:  1. A high fiber diet with liberal fluid intake has been recommended.  2. Serial CBCs along with iron supplementation has been advised.  3. Outpatient follow-up in the next two weeks.  4. The patient was found to be on her menstrual cycle with heavy bleeding     during the procedure.  I suspect this might be a source of iron     deficiency and therefore follow-up with a gynecologist and primary care     [name] has been recommended.  On discussing the patient's reasons for     iron deficiency anemia with her husband, I was advised by her spouse that     she has had unusually heavy vaginal bleeding in the recent past and     therefore gynecological evaluation is being recommended as mentioned     above.  Anselmo Rod, M.D.    JNM/MEDQ  D:  04/30/2003  T:  04/30/2003  Job:  [id number]   cc:   Olene Craven, M.D.  [address]  [address]  [place]  [place] [address]  Fax: [phone]

## 2011-05-01 ENCOUNTER — Other Ambulatory Visit: Payer: Self-pay | Admitting: Family Medicine

## 2011-05-03 NOTE — Telephone Encounter (Signed)
This was given on 3/27 with refills. Refused and left voice message notifying the pharmacy.

## 2011-05-26 ENCOUNTER — Other Ambulatory Visit: Payer: Self-pay | Admitting: Family Medicine

## 2011-05-26 NOTE — Telephone Encounter (Signed)
Sent one refill, pt is due for appt.

## 2011-05-28 ENCOUNTER — Other Ambulatory Visit: Payer: Self-pay | Admitting: Family Medicine

## 2011-05-28 NOTE — Telephone Encounter (Signed)
Refill sent.

## 2011-06-09 ENCOUNTER — Ambulatory Visit (INDEPENDENT_AMBULATORY_CARE_PROVIDER_SITE_OTHER): Payer: 59 | Admitting: Family Medicine

## 2011-06-09 DIAGNOSIS — I10 Essential (primary) hypertension: Secondary | ICD-10-CM

## 2011-06-09 DIAGNOSIS — J309 Allergic rhinitis, unspecified: Secondary | ICD-10-CM

## 2011-06-09 MED ORDER — FLUTICASONE PROPIONATE 50 MCG/ACT NA SUSP: 2.0000 | Freq: Every day | NASAL | Status: DC

## 2011-06-09 NOTE — Assessment & Plan Note (Signed)
BP well controlled today.  Asymptomatic.  No changes at this time.  Will follow.

## 2011-06-09 NOTE — Patient Instructions (Signed)
Schedule your complete physical for December Your blood pressure is fantastic!  Keep up the good work! Start the nasal spray- 2 sprays in each nostril daily- to open your congestion and decrease the post nasal drip Add Claritin or Zyrtec daily Call with any questions or concerns Have a great summer!

## 2011-06-09 NOTE — Progress Notes (Signed)
  Subjective:    Patient ID: Abigail Ellis, female    DOB: 1956/07/02, 55 y.o.   MRN: [id number]  HPI HTN- chronic problem, well controlled today on HCTZ, Toprol, Norvasc.  (generic amlodipine caused severe leg cramps).  Denies CP, SOB, HAs, visual changes, edema.  Chest congestion- sxs started 2 weeks ago.  Pt feels she is wheezing.  Denies cough.  + PND.  Not currently on allergy meds.   Review of Systems For ROS see HPI     Objective:   Physical Exam  Constitutional: She is oriented to person, place, and time. She appears well-developed and well-nourished. No distress.  HENT:  Head: Normocephalic and atraumatic.       Copious PND  Eyes: Conjunctivae and EOM are normal. Pupils are equal, round, and reactive to light.  Neck: Normal range of motion. Neck supple. No thyromegaly present.  Cardiovascular: Normal rate, regular rhythm, normal heart sounds and intact distal pulses.   No murmur heard. Pulmonary/Chest: Effort normal and breath sounds normal. No respiratory distress.  Abdominal: Soft. She exhibits no distension. There is no tenderness.  Musculoskeletal: She exhibits no edema.  Lymphadenopathy:    She has no cervical adenopathy.  Neurological: She is alert and oriented to person, place, and time.  Skin: Skin is warm and dry.  Psychiatric: She has a normal mood and affect. Her behavior is normal.          Assessment & Plan:

## 2011-06-09 NOTE — Assessment & Plan Note (Signed)
Likely the cause of pt's chest tightness.  Start nasal spray and antihistamine daily.  Reviewed supportive care and red flags that should prompt return.  Pt expressed understanding and is in agreement w/ plan.

## 2011-06-25 ENCOUNTER — Other Ambulatory Visit: Payer: Self-pay | Admitting: Family Medicine

## 2011-06-25 NOTE — Telephone Encounter (Signed)
Refill sent.

## 2011-08-06 ENCOUNTER — Other Ambulatory Visit: Payer: Self-pay | Admitting: Family Medicine

## 2011-08-06 DIAGNOSIS — Z1231 Encounter for screening mammogram for malignant neoplasm of breast: Secondary | ICD-10-CM

## 2011-08-31 ENCOUNTER — Ambulatory Visit (HOSPITAL_COMMUNITY)
Admission: RE | Admit: 2011-08-31 | Discharge: 2011-08-31 | Disposition: A | Discharge status: Home / Self Care | Payer: 59 | Source: Ambulatory Visit | Attending: Family Medicine | Admitting: Family Medicine

## 2011-08-31 DIAGNOSIS — Z1231 Encounter for screening mammogram for malignant neoplasm of breast: Secondary | ICD-10-CM

## 2011-09-02 ENCOUNTER — Other Ambulatory Visit: Payer: Self-pay | Admitting: Family Medicine

## 2011-09-02 MED ORDER — COLESEVELAM HCL 3.75 G PO PACK: 3.7500 g | PACK | Freq: Every day | ORAL | Status: DC

## 2011-09-02 NOTE — Telephone Encounter (Signed)
Done

## 2011-09-02 NOTE — Telephone Encounter (Signed)
Patient wants rx for welchol change form capsule to powder - [facility] [address]

## 2011-09-28 ENCOUNTER — Other Ambulatory Visit: Payer: Self-pay | Admitting: Family Medicine

## 2011-10-25 ENCOUNTER — Other Ambulatory Visit: Payer: Self-pay | Admitting: Family Medicine

## 2011-10-25 NOTE — Telephone Encounter (Signed)
rx sent to pharmacy by e-script For fenofibrate

## 2011-12-01 ENCOUNTER — Encounter: Payer: 59 | Admitting: Family Medicine

## 2011-12-23 ENCOUNTER — Other Ambulatory Visit: Payer: Self-pay | Admitting: Family Medicine

## 2011-12-23 MED ORDER — HYDROCHLOROTHIAZIDE 12.5 MG PO TABS: 12.5000 mg | ORAL_TABLET | Freq: Every day | ORAL | Status: DC

## 2011-12-23 NOTE — Telephone Encounter (Signed)
rx sent to pharmacy by e-script  

## 2012-01-03 ENCOUNTER — Encounter: Payer: 59 | Admitting: Family Medicine

## 2012-01-06 ENCOUNTER — Encounter: Payer: Self-pay | Admitting: Family Medicine

## 2012-01-06 ENCOUNTER — Ambulatory Visit (INDEPENDENT_AMBULATORY_CARE_PROVIDER_SITE_OTHER): Payer: 59 | Admitting: Family Medicine

## 2012-01-06 DIAGNOSIS — E785 Hyperlipidemia, unspecified: Secondary | ICD-10-CM

## 2012-01-06 DIAGNOSIS — I10 Essential (primary) hypertension: Secondary | ICD-10-CM

## 2012-01-06 DIAGNOSIS — Z01419 Encounter for gynecological examination (general) (routine) without abnormal findings: Secondary | ICD-10-CM

## 2012-01-06 DIAGNOSIS — Z0000 Encounter for general adult medical examination without abnormal findings: Secondary | ICD-10-CM | POA: Insufficient documentation

## 2012-01-06 LAB — TSH: TSH: 0.61 u[IU]/mL (ref 0.35–5.50)

## 2012-01-06 LAB — CBC WITH DIFFERENTIAL/PLATELET
Basophils Relative: 0.4 % (ref 0.0–3.0)
Eosinophils Relative: 1.3 % (ref 0.0–5.0)
HCT: 36.8 % (ref 36.0–46.0)
MCV: 67.1 fl — ABNORMAL LOW (ref 78.0–100.0)
Monocytes Absolute: 0.5 10*3/uL (ref 0.1–1.0)
Neutrophils Relative %: 64.1 % (ref 43.0–77.0)
RBC: 5.49 Mil/uL — ABNORMAL HIGH (ref 3.87–5.11)
WBC: 5.6 10*3/uL (ref 4.5–10.5)

## 2012-01-06 LAB — BASIC METABOLIC PANEL
BUN: 16 mg/dL (ref 6–23)
CO2: 28 mEq/L (ref 19–32)
Chloride: 105 mEq/L (ref 96–112)
GFR: 82.33 mL/min (ref >60.00)
Glucose, Bld: 95 mg/dL (ref 70–99)
Potassium: 3.6 mEq/L (ref 3.5–5.1)

## 2012-01-06 LAB — HEPATIC FUNCTION PANEL
ALT: 27 U/L (ref 0–35)
AST: 27 U/L (ref 0–37)
Albumin: 4.4 g/dL (ref 3.5–5.2)
Total Protein: 7.2 g/dL (ref 6.0–8.3)

## 2012-01-06 NOTE — Patient Instructions (Signed)
Follow up in 6 months to recheck blood pressure and cholesterol We'll notify you of your lab results STOP the Welchol Keep up the good work!  You look great! Enjoy the snow!

## 2012-01-06 NOTE — Assessment & Plan Note (Signed)
Stop Welchol due to gas and constipation

## 2012-01-06 NOTE — Assessment & Plan Note (Signed)
Well controlled.  Asymptomatic.  No changes. 

## 2012-01-06 NOTE — Progress Notes (Signed)
  Subjective:    Patient ID: Abigail Ellis, female    DOB: [birth year], 56 y.o.   MRN: [id number]  HPI CPE- no concerns today.  UTD on pap, mammo, colonoscopy.   Review of Systems Patient reports no vision/ hearing changes, adenopathy,fever, weight change,  persistant/recurrent hoarseness , swallowing issues, chest pain, palpitations, edema, persistant/recurrent cough, hemoptysis, dyspnea (rest/exertional/paroxysmal nocturnal), gastrointestinal bleeding (melena, rectal bleeding), abdominal pain, significant heartburn, bowel changes, GU symptoms (dysuria, hematuria, incontinence), Gyn symptoms (abnormal  bleeding, pain),  syncope, focal weakness, memory loss, numbness & tingling, skin/hair/nail changes, abnormal bruising or bleeding, anxiety, or depression.     Objective:   Physical Exam  General Appearance:    Alert, cooperative, no distress, appears stated age  Head:    Normocephalic, without obvious abnormality, atraumatic  Eyes:    PERRL, conjunctiva/corneas clear, EOM's intact, fundi    benign, both eyes  Ears:    Normal TM's and external ear canals, both ears  Nose:   Nares normal, septum midline, mucosa normal, no drainage    or sinus tenderness  Throat:   Lips, mucosa, and tongue normal; teeth and gums normal  Neck:   Supple, symmetrical, trachea midline, no adenopathy;    Thyroid: no enlargement/tenderness/nodules  Back:     Symmetric, no curvature, ROM normal, no CVA tenderness  Lungs:     Clear to auscultation bilaterally, respirations unlabored  Chest Wall:    No tenderness or deformity   Heart:    Regular rate and rhythm, S1 and S2 normal, no murmur, rub   or gallop  Breast Exam:    No tenderness, masses, or nipple abnormality  Abdomen:     Soft, non-tender, bowel sounds active all four quadrants,    no masses, no organomegaly  Genitalia:    Deferred at pt's request  Rectal:    Extremities:   Extremities normal, atraumatic, no cyanosis or edema  Pulses:   2+ and symmetric  all extremities  Skin:   Skin color, texture, turgor normal, no rashes or lesions  Lymph nodes:   Cervical, supraclavicular, and axillary nodes normal  Neurologic:   CNII-XII intact, normal strength, sensation and reflexes    throughout          Assessment & Plan:

## 2012-01-06 NOTE — Assessment & Plan Note (Signed)
Pt's PE WNL.  UTD on pap, mammo, colonoscopy.  Check labs.  Anticipatory guidance provided.  

## 2012-01-08 LAB — VITAMIN D 1,25 DIHYDROXY
Vitamin D 1, 25 (OH)2 Total: 75 pg/mL — ABNORMAL HIGH (ref 18–72)
Vitamin D2 1, 25 (OH)2: <8 pg/mL
Vitamin D3 1, 25 (OH)2: 75 pg/mL

## 2012-01-10 ENCOUNTER — Encounter: Payer: Self-pay | Admitting: *Deleted

## 2012-01-21 ENCOUNTER — Telehealth: Payer: Self-pay | Admitting: Family Medicine

## 2012-01-21 DIAGNOSIS — I10 Essential (primary) hypertension: Secondary | ICD-10-CM

## 2012-01-21 MED ORDER — TOPROL XL 50 MG PO TB24: 50.0000 mg | ORAL_TABLET | Freq: Every day | ORAL | Status: DC

## 2012-01-21 NOTE — Telephone Encounter (Signed)
Refill: Welchol 625 mg tablet. Take 3 tablets 2x a day. Qty. 180. Last fill 12-23-11  Refill: Toprol XL 50 mg tablet. Take 1 tablet by mouth every day. Qty. 30. Last fill 12-23-11

## 2012-01-21 NOTE — Telephone Encounter (Signed)
Telephone message opened in error. BPC

## 2012-01-21 NOTE — Telephone Encounter (Signed)
Refill: Welchol 625 mg tablet. Take 3 tablets 2x a day. Qty. 180. Last fill 12-23-11 ° °Refill: Toprol XL 50 mg tablet. Take 1 tablet by mouth every day. Qty. 30. Last fill 12-23-11 °

## 2012-01-21 NOTE — Telephone Encounter (Signed)
Noted pt has already been sent welachol for a qty of 180, did send toprol XL via escribe

## 2012-01-24 ENCOUNTER — Other Ambulatory Visit: Payer: Self-pay | Admitting: Family Medicine

## 2012-01-25 MED ORDER — HYDROCHLOROTHIAZIDE 12.5 MG PO TABS: 12.5000 mg | ORAL_TABLET | Freq: Every day | ORAL | Status: DC

## 2012-01-25 NOTE — Telephone Encounter (Signed)
rx sent to pharmacy by e-script for the HCTZ via escribe Noted the wellchol has been discontinued by MD Beverely Low, per side effect on 01-06-12, called pt to clarify  .left message to have patient return my call.

## 2012-01-26 ENCOUNTER — Other Ambulatory Visit: Payer: Self-pay | Admitting: Family Medicine

## 2012-01-26 NOTE — Telephone Encounter (Signed)
Pt called to clarify that she is NO longer taking the wellchol per side effects and that she did not ask the pharmacy to send the request. Pt aware the HCTZ was sent. Pt understood

## 2012-01-27 MED ORDER — HYDROCHLOROTHIAZIDE 12.5 MG PO TABS: 12.5000 mg | ORAL_TABLET | Freq: Every day | ORAL | Status: DC

## 2012-01-27 NOTE — Telephone Encounter (Signed)
rx sent to pharmacy by e-script  

## 2012-02-07 ENCOUNTER — Telehealth: Payer: Self-pay | Admitting: Family Medicine

## 2012-02-07 NOTE — Telephone Encounter (Signed)
Refill: Welchol 625 mg tablet. Take 3 tablets two times a day. Qty 180. Last fill 12-23-11

## 2012-02-07 NOTE — Telephone Encounter (Signed)
Called [facility] to advise the RX for welechol has been filled on 12-23-11 for #180 however pt is no longer taking this medication per side effects, kim the [facility] rep advised that she will remove this request

## 2012-02-28 ENCOUNTER — Telehealth: Payer: Self-pay | Admitting: Family Medicine

## 2012-02-28 DIAGNOSIS — I10 Essential (primary) hypertension: Secondary | ICD-10-CM

## 2012-02-28 MED ORDER — TOPROL XL 50 MG PO TB24: 50.0000 mg | ORAL_TABLET | Freq: Every day | ORAL | Status: DC

## 2012-02-28 MED ORDER — NORVASC 5 MG PO TABS: ORAL_TABLET | ORAL | Status: DC

## 2012-02-28 NOTE — Telephone Encounter (Signed)
Refills for  1-Norvasc 5MG  Tablet  Qty 30 Take -tablet by mouth every day Last fill date 3.9.13  2nd-Toprol XL 50MG  Tablet Qty 30 Take 1-tablet by mouth every day Last fill date 1.10.13

## 2012-02-28 NOTE — Telephone Encounter (Signed)
Rx sent 

## 2012-04-24 ENCOUNTER — Telehealth: Payer: Self-pay | Admitting: Family Medicine

## 2012-04-24 DIAGNOSIS — E785 Hyperlipidemia, unspecified: Secondary | ICD-10-CM

## 2012-04-24 NOTE — Telephone Encounter (Signed)
Refill: Crestor 10 mg tablet. Take 1 tablet by mouth at bedtime. Qty 30. Last fill 12-26-11

## 2012-04-25 NOTE — Telephone Encounter (Signed)
.  left message to have patient return my call to clarify if the pt is still taking the medication crestor

## 2012-04-27 MED ORDER — ROSUVASTATIN CALCIUM 10 MG PO TABS: 10.0000 mg | ORAL_TABLET | Freq: Every day | ORAL | Status: DC

## 2012-04-27 NOTE — Telephone Encounter (Signed)
Pharmacy called stating that Pt call to check status of refill, Rx sent

## 2012-05-17 ENCOUNTER — Ambulatory Visit (INDEPENDENT_AMBULATORY_CARE_PROVIDER_SITE_OTHER): Payer: 59 | Admitting: Family Medicine

## 2012-05-17 ENCOUNTER — Encounter: Payer: Self-pay | Admitting: Family Medicine

## 2012-05-17 VITALS — BP 122/75 | HR 62 | Temp 98.3°F | Ht 59.0 in | Wt 137.0 lb

## 2012-05-17 DIAGNOSIS — E785 Hyperlipidemia, unspecified: Secondary | ICD-10-CM

## 2012-05-17 DIAGNOSIS — I10 Essential (primary) hypertension: Secondary | ICD-10-CM

## 2012-05-17 DIAGNOSIS — H60399 Other infective otitis externa, unspecified ear: Secondary | ICD-10-CM

## 2012-05-17 DIAGNOSIS — H6090 Unspecified otitis externa, unspecified ear: Secondary | ICD-10-CM | POA: Insufficient documentation

## 2012-05-17 LAB — BASIC METABOLIC PANEL
CO2: 31 mEq/L (ref 19–32)
Calcium: 9.5 mg/dL (ref 8.4–10.5)
Chloride: 104 mEq/L (ref 96–112)
Glucose, Bld: 89 mg/dL (ref 70–99)
Sodium: 143 mEq/L (ref 135–145)

## 2012-05-17 LAB — HEPATIC FUNCTION PANEL
ALT: 21 U/L (ref 0–35)
Albumin: 4.4 g/dL (ref 3.5–5.2)
Alkaline Phosphatase: 47 U/L (ref 39–117)
Total Protein: 7.2 g/dL (ref 6.0–8.3)

## 2012-05-17 LAB — LIPID PANEL
HDL: 82.2 mg/dL (ref >39.00)
Total CHOL/HDL Ratio: 2

## 2012-05-17 MED ORDER — OFLOXACIN 0.3 % OT SOLN: 10.0000 [drp] | Freq: Every day | OTIC | Status: AC

## 2012-05-17 NOTE — Progress Notes (Signed)
  Subjective:    Patient ID: Abigail Ellis, female    DOB: 08-20-1956, 56 y.o.   MRN: [id number]  HPI HTN- chronic problem, excellent control today.  On HCTZ, Norvasc.  Denies chest pain, SOB, HAs, visual changes, edema.   Hyperlipidemia- chronic problem, on Crestor and fenofibrate.  Denies abd pain, N/V, myalgias.  Due for labs.  Ear pain- L ear pain x1 week.  Worsening over the last 2 days.  Saw blood on qtip.  Hearing is now muffled.  No fever.  L side of face was swollen- took ibuprofen.   Review of Systems For ROS see HPI     Objective:   Physical Exam  Vitals reviewed. Constitutional: She is oriented to person, place, and time. She appears well-developed and well-nourished. No distress.  HENT:  Head: Normocephalic and atraumatic.  Right Ear: Tympanic membrane, external ear and ear canal normal.  Left Ear: Tympanic membrane normal. There is swelling (of EAC) and tenderness (w/ manipulation of outer ear).       Pearly debris and drainage in L EAC consistent w/ otitis externa  Eyes: Conjunctivae and EOM are normal. Pupils are equal, round, and reactive to light.  Neck: Normal range of motion. Neck supple. No thyromegaly present.  Cardiovascular: Normal rate, regular rhythm, normal heart sounds and intact distal pulses.   No murmur heard. Pulmonary/Chest: Effort normal and breath sounds normal. No respiratory distress.  Abdominal: Soft. She exhibits no distension. There is no tenderness.  Musculoskeletal: She exhibits no edema.  Lymphadenopathy:    She has no cervical adenopathy.  Neurological: She is alert and oriented to person, place, and time.  Skin: Skin is warm and dry.  Psychiatric: She has a normal mood and affect. Her behavior is normal.          Assessment & Plan:

## 2012-05-17 NOTE — Assessment & Plan Note (Signed)
New.  Start Floxin Otic ear drops.  Reviewed supportive care and red flags that should prompt return.  Pt expressed understanding and is in agreement w/ plan.

## 2012-05-17 NOTE — Patient Instructions (Signed)
Schedule your complete physical for after Jan 24 [facility] notify you of your lab results and make any changes if needed Start the ear drops for 7 days Continue tylenol or ibuprofen for pain relief Call with any questions or concerns Have a great summer!!!

## 2012-05-17 NOTE — Assessment & Plan Note (Signed)
Chronic problem.  Tolerating statin and fenofibrate w/out difficulty.  Due for labs.  Adjust meds prn.

## 2012-05-17 NOTE — Assessment & Plan Note (Signed)
Chronic problem.  Well controlled on current meds.  Asymptomatic.  No changes. 

## 2012-05-19 ENCOUNTER — Telehealth: Payer: Self-pay | Admitting: Family Medicine

## 2012-05-19 MED ORDER — FENOFIBRATE 160 MG PO TABS: 160.0000 mg | ORAL_TABLET | Freq: Every day | ORAL | Status: DC

## 2012-05-19 NOTE — Telephone Encounter (Signed)
Refill: Fenofibrate 160 mg tablet. Take 1 tablet by mouth every day. Qty 30. Last fill 04-23-12

## 2012-05-19 NOTE — Telephone Encounter (Signed)
rx sent to pharmacy by e-script  

## 2012-06-19 ENCOUNTER — Encounter: Payer: Self-pay | Admitting: *Deleted

## 2012-06-19 ENCOUNTER — Telehealth: Payer: Self-pay | Admitting: Family Medicine

## 2012-06-19 DIAGNOSIS — I10 Essential (primary) hypertension: Secondary | ICD-10-CM

## 2012-06-19 MED ORDER — METOPROLOL SUCCINATE ER 50 MG PO TB24: 50.0000 mg | ORAL_TABLET | Freq: Every day | ORAL | Status: DC

## 2012-06-19 NOTE — Telephone Encounter (Signed)
refill toprol xl 50mg  tablet #30, take one tablet by mouth daily, last fill 6.7.13, last ov 6.5.13

## 2012-06-19 NOTE — Telephone Encounter (Signed)
Rx done; pt also having trouble seeing lab results with My Chart; mailed per patient request/SLS

## 2012-08-13 ENCOUNTER — Other Ambulatory Visit: Payer: Self-pay | Admitting: Family Medicine

## 2012-08-15 NOTE — Telephone Encounter (Signed)
rx sent to pharmacy by e-script  

## 2012-08-17 ENCOUNTER — Other Ambulatory Visit: Payer: Self-pay | Admitting: Family Medicine

## 2012-08-17 DIAGNOSIS — Z1231 Encounter for screening mammogram for malignant neoplasm of breast: Secondary | ICD-10-CM

## 2012-09-05 ENCOUNTER — Ambulatory Visit (HOSPITAL_COMMUNITY)
Admission: RE | Admit: 2012-09-05 | Discharge: 2012-09-05 | Disposition: A | Discharge status: Home / Self Care | Payer: 59 | Source: Ambulatory Visit | Attending: Family Medicine | Admitting: Family Medicine

## 2012-09-05 DIAGNOSIS — Z1231 Encounter for screening mammogram for malignant neoplasm of breast: Secondary | ICD-10-CM

## 2012-10-12 ENCOUNTER — Telehealth: Payer: Self-pay | Admitting: Family Medicine

## 2012-10-12 DIAGNOSIS — I10 Essential (primary) hypertension: Secondary | ICD-10-CM

## 2012-10-12 MED ORDER — METOPROLOL SUCCINATE ER 50 MG PO TB24: 50.0000 mg | ORAL_TABLET | Freq: Every day | ORAL | Status: DC

## 2012-10-12 MED ORDER — NORVASC 5 MG PO TABS: ORAL_TABLET | ORAL | Status: DC

## 2012-10-12 NOTE — Telephone Encounter (Signed)
rx sent to pharmacy by e-script to last pt  Per pt was advised in discharge summary to schedule CPE after 01-05-13

## 2012-10-12 NOTE — Telephone Encounter (Signed)
Refill: Toprol xl 50mg  tablet. Take 1 tablet by mouth daily. Qty 30. Last fill 09-16-12  Norvasc 5 mg tablet. Take 1 tablet every day. Qty 30. Last fill 09-16-12

## 2012-10-30 ENCOUNTER — Telehealth: Payer: Self-pay | Admitting: Family Medicine

## 2012-10-30 DIAGNOSIS — E785 Hyperlipidemia, unspecified: Secondary | ICD-10-CM

## 2012-10-30 MED ORDER — ROSUVASTATIN CALCIUM 10 MG PO TABS: 10.0000 mg | ORAL_TABLET | Freq: Every day | ORAL | Status: DC

## 2012-10-30 NOTE — Telephone Encounter (Signed)
Rx sent 

## 2012-10-30 NOTE — Telephone Encounter (Signed)
Refill: Crestor 10 mg tablet. Take 1 tablet by mouth at bedtime. Qty 30. Last fill 06-17-12

## 2012-11-24 ENCOUNTER — Ambulatory Visit (INDEPENDENT_AMBULATORY_CARE_PROVIDER_SITE_OTHER): Payer: 59 | Admitting: Internal Medicine

## 2012-11-24 ENCOUNTER — Encounter: Payer: Self-pay | Admitting: Internal Medicine

## 2012-11-24 VITALS — BP 126/84 | HR 76 | Temp 97.6°F | Ht 59.0 in | Wt 142.0 lb

## 2012-11-24 DIAGNOSIS — J329 Chronic sinusitis, unspecified: Secondary | ICD-10-CM

## 2012-11-24 MED ORDER — AZITHROMYCIN 250 MG PO TABS: ORAL_TABLET | ORAL | Status: DC

## 2012-11-24 NOTE — Progress Notes (Signed)
Subjective:    Patient ID: Abigail Ellis, female    DOB: 10-11-1956, 56 y.o.   MRN: [id number]  HPI  Pt presents to the clinic today with c/o a sore throat and earache. This started 3 days ago. She has not taken anything OTC. She does feel like she is having sinus drainage which is causing her sore throat. She doees have a history of allergies and rhinosinusitis.   Review of Systems      Past Medical History  Diagnosis Date  . Hypertension   . Anemia   . Hyperlipidemia   . Migraines     Current Outpatient Prescriptions  Medication Sig Dispense Refill  . aspirin 81 MG tablet Take 81 mg by mouth daily.        Marland Kitchen b complex vitamins tablet Take 1 tablet by mouth daily.        Marland Kitchen BIOTIN PO Take by mouth daily.        . fenofibrate 160 MG tablet Take 1 tablet (160 mg total) by mouth daily.  30 tablet  6  . fluticasone (FLONASE) 50 MCG/ACT nasal spray Place 2 sprays into the nose daily.  16 g  3  . hydrochlorothiazide (HYDRODIURIL) 12.5 MG tablet Take 1 tablet (12.5 mg total) by mouth daily.  30 tablet  6  . hydrochlorothiazide (HYDRODIURIL) 12.5 MG tablet TAKE 1 TABLET (12.5 MG TOTAL) BY MOUTH DAILY.  30 tablet  6  . metoprolol succinate (TOPROL XL) 50 MG 24 hr tablet Take 1 tablet (50 mg total) by mouth daily.  30 tablet  2  . NORVASC 5 MG tablet TAKE 1 TABLET EVERY DAY  30 tablet  2  . rosuvastatin (CRESTOR) 10 MG tablet Take 1 tablet (10 mg total) by mouth at bedtime.  30 tablet  2    Allergies  Allergen Reactions  . Statins     REACTION: leg pain, jiont pain    Family History  Problem Relation Age of Onset  . Hypertension Mother   . Heart disease Mother   . Hypertension Father   . Heart disease Father   . Cancer Maternal Uncle     colon cancer    History   Social History  . Marital Status: Married    Spouse Name: N/A    Number of Children: N/A  . Years of Education: N/A   Occupational History  . Not on file.   Social History Main Topics  . Smoking status:  Current Some Day Smoker -- 0.3 packs/day for 10 years  . Smokeless tobacco: Not on file  . Alcohol Use: No  . Drug Use: No  . Sexually Active: Not on file   Other Topics Concern  . Not on file   Social History Narrative  . No narrative on file     Constitutional: Denies fever, malaise, fatigue, headache or abrupt weight changes.  HEENT: Pt reports sore throat and earache. Denies eye pain, eye redness, ringing in the ears, wax buildup, runny nose, nasal congestion, bloody nose. Respiratory: Denies difficulty breathing, shortness of breath, cough or sputum production.   Cardiovascular: Denies chest pain, chest tightness, palpitations or swelling in the hands or feet.     No other specific complaints in a complete review of systems (except as listed in HPI above).  Objective:   Physical Exam   BP 126/84  Pulse 76  Temp 97.6 F (36.4 C) (Oral)  Ht 4\' 11"  (1.499 m)  Wt 142 lb (64.411 kg)  BMI 28.68  kg/m2  SpO2 96% Wt Readings from Last 3 Encounters:  11/24/12 142 lb (64.411 kg)  05/17/12 137 lb (62.143 kg)  01/06/12 138 lb 12.8 oz (62.959 kg)    General: Appears her stated age, well developed, well nourished in NAD. Skin: Warm, dry and intact. No rashes, lesions or ulcerations noted. HEENT: Head: normal shape and size, sinuses tender to palpation; Eyes: sclera white, no icterus, conjunctiva pink, PERRLA and EOMs intact; Ears: Tm's gray and intact, normal light reflex; Nose: mucosa pink and moist, septum midline; Throat/Mouth: Teeth present, mucosa erythematous and moist, + PND,no exudate, lesions or ulcerations noted.  Neck: Normal range of motion. Neck supple, trachea midline. No massses, lumps or thyromegaly present.  Cardiovascular: Normal rate and rhythm. S1,S2 noted.  No murmur, rubs or gallops noted. No JVD or BLE edema. No carotid bruits noted. Pulmonary/Chest: Normal effort and positive vesicular breath sounds. No respiratory distress. No wheezes, rales or ronchi  noted.        Assessment & Plan:   Acute sinusitis:  Azithromax 250 mg BID Flonase as needed  RTC as needed or if symptoms persist   RTC as needed or if symptoms persist

## 2012-11-24 NOTE — Patient Instructions (Addendum)

## 2012-11-25 ENCOUNTER — Encounter: Payer: Self-pay | Admitting: Internal Medicine

## 2012-12-18 ENCOUNTER — Other Ambulatory Visit: Payer: Self-pay | Admitting: Family Medicine

## 2012-12-18 DIAGNOSIS — I10 Essential (primary) hypertension: Secondary | ICD-10-CM

## 2012-12-18 MED ORDER — METOPROLOL SUCCINATE ER 50 MG PO TB24: 50.0000 mg | ORAL_TABLET | Freq: Every day | ORAL | Status: DC

## 2012-12-18 MED ORDER — NORVASC 5 MG PO TABS: ORAL_TABLET | ORAL | Status: DC

## 2012-12-18 NOTE — Telephone Encounter (Signed)
Rx's sent to the pharmacy by e-script.//AB/CMA 

## 2012-12-18 NOTE — Telephone Encounter (Signed)
refill x 2 -- last ov wt/labs 6.5.13 1-TOPROL-XL 50 MG Take 1 tablet (50 mg total) by mouth daily #30 wt/3-refills last fill 10.05.13  2-Norvasc 5mg  tablet Take 1 tablet every day #30 wt/3-refills last fill 10.5.13

## 2013-01-10 ENCOUNTER — Encounter: Payer: 59 | Admitting: Family Medicine

## 2013-01-12 ENCOUNTER — Ambulatory Visit (INDEPENDENT_AMBULATORY_CARE_PROVIDER_SITE_OTHER): Payer: 59 | Admitting: Family Medicine

## 2013-01-12 ENCOUNTER — Other Ambulatory Visit (HOSPITAL_COMMUNITY)
Admission: RE | Admit: 2013-01-12 | Discharge: 2013-01-12 | Disposition: A | Discharge status: Home / Self Care | Payer: 59 | Source: Ambulatory Visit | Attending: Family Medicine | Admitting: Family Medicine

## 2013-01-12 ENCOUNTER — Encounter: Payer: Self-pay | Admitting: Family Medicine

## 2013-01-12 VITALS — BP 120/78 | HR 54 | Temp 98.3°F | Ht <= 58 in | Wt 142.2 lb

## 2013-01-12 DIAGNOSIS — I10 Essential (primary) hypertension: Secondary | ICD-10-CM

## 2013-01-12 DIAGNOSIS — Z01419 Encounter for gynecological examination (general) (routine) without abnormal findings: Secondary | ICD-10-CM

## 2013-01-12 DIAGNOSIS — E785 Hyperlipidemia, unspecified: Secondary | ICD-10-CM

## 2013-01-12 DIAGNOSIS — Z124 Encounter for screening for malignant neoplasm of cervix: Secondary | ICD-10-CM

## 2013-01-12 LAB — BASIC METABOLIC PANEL
Calcium: 9.3 mg/dL (ref 8.4–10.5)
GFR: 84.56 mL/min (ref >60.00)
Glucose, Bld: 97 mg/dL (ref 70–99)
Sodium: 138 mEq/L (ref 135–145)

## 2013-01-12 LAB — HEPATIC FUNCTION PANEL
Albumin: 4.5 g/dL (ref 3.5–5.2)
Alkaline Phosphatase: 61 U/L (ref 39–117)

## 2013-01-12 LAB — CBC WITH DIFFERENTIAL/PLATELET
Basophils Absolute: 0.1 10*3/uL (ref 0.0–0.1)
Lymphocytes Relative: 26.7 % (ref 12.0–46.0)
Lymphs Abs: 1.7 10*3/uL (ref 0.7–4.0)
Monocytes Relative: 7.9 % (ref 3.0–12.0)
Neutrophils Relative %: 63.6 % (ref 43.0–77.0)
Platelets: 288 10*3/uL (ref 150.0–400.0)
RDW: 15.9 % — ABNORMAL HIGH (ref 11.5–14.6)

## 2013-01-12 LAB — LIPID PANEL
HDL: 66.7 mg/dL (ref >39.00)
Total CHOL/HDL Ratio: 3
Triglycerides: 148 mg/dL (ref 0.0–149.0)

## 2013-01-12 LAB — TSH: TSH: 0.66 u[IU]/mL (ref 0.35–5.50)

## 2013-01-12 MED ORDER — ROSUVASTATIN CALCIUM 10 MG PO TABS: 10.0000 mg | ORAL_TABLET | Freq: Every day | ORAL | Status: DC

## 2013-01-12 MED ORDER — NORVASC 10 MG PO TABS: 10.0000 mg | ORAL_TABLET | Freq: Every day | ORAL | Status: DC

## 2013-01-12 MED ORDER — FENOFIBRATE 160 MG PO TABS: 160.0000 mg | ORAL_TABLET | Freq: Every day | ORAL | Status: DC

## 2013-01-12 MED ORDER — METOPROLOL SUCCINATE ER 50 MG PO TB24: 50.0000 mg | ORAL_TABLET | Freq: Every day | ORAL | Status: DC

## 2013-01-12 MED ORDER — HYDROCHLOROTHIAZIDE 12.5 MG PO TABS: 12.5000 mg | ORAL_TABLET | Freq: Every day | ORAL | Status: DC

## 2013-01-12 NOTE — Progress Notes (Signed)
  Subjective:    Patient ID: Abigail Ellis, female    DOB: [birth year], 57 y.o.   MRN: [id number]  HPI CPE- due for pap, colonoscopy (Dr Loreta Ave).  Hyperlipidemia- stopped crestor and fenofibrate due to severe bloating and constipation.  Was off meds x2 months.  Now taking 1 tab of Crestor every other day and 1/2 tab of fenofibrate every other day.   Review of Systems Patient reports no vision/ hearing changes, adenopathy,fever, weight change,  persistant/recurrent hoarseness , swallowing issues, chest pain, palpitations, edema, persistant/recurrent cough, hemoptysis, dyspnea (rest/exertional/paroxysmal nocturnal), gastrointestinal bleeding (melena, rectal bleeding), abdominal pain, significant heartburn, bowel changes, GU symptoms (dysuria, hematuria, incontinence), Gyn symptoms (abnormal  bleeding, pain),  syncope, focal weakness, memory loss, numbness & tingling, skin/hair/nail changes, abnormal bruising or bleeding, anxiety, or depression.     Objective:   Physical Exam  General Appearance:    Alert, cooperative, no distress, appears stated age  Head:    Normocephalic, without obvious abnormality, atraumatic  Eyes:    PERRL, conjunctiva/corneas clear, EOM's intact, fundi    benign, both eyes  Ears:    Normal TM's and external ear canals, both ears  Nose:   Nares normal, septum midline, mucosa normal, no drainage    or sinus tenderness  Throat:   Lips, mucosa, and tongue normal; teeth and gums normal  Neck:   Supple, symmetrical, trachea midline, no adenopathy;    Thyroid: no enlargement/tenderness/nodules  Back:     Symmetric, no curvature, ROM normal, no CVA tenderness  Lungs:     Clear to auscultation bilaterally, respirations unlabored  Chest Wall:    No tenderness or deformity   Heart:    Regular rate and rhythm, S1 and S2 normal, no murmur, rub   or gallop  Breast Exam:    No tenderness, masses, or nipple abnormality  Abdomen:     Soft, non-tender, bowel sounds active all four  quadrants,    no masses, no organomegaly  Genitalia:    External genitalia normal, cervix normal in appearance, no CMT, uterus in normal size and position, adnexa w/out mass or tenderness, mucosa pink and moist, no lesions or discharge present  Rectal:    Normal external appearance  Extremities:   Extremities normal, atraumatic, no cyanosis or edema  Pulses:   2+ and symmetric all extremities  Skin:   Skin color, texture, turgor normal, no rashes or lesions  Lymph nodes:   Cervical, supraclavicular, and axillary nodes normal  Neurologic:   CNII-XII intact, normal strength, sensation and reflexes    throughout          Assessment & Plan:

## 2013-01-12 NOTE — Patient Instructions (Addendum)
Follow up in 6 months to recheck cholesterol and BP We'll notify you of your lab results and make any changes if needed Keep up the good work!  You look good! Call with any questions or concerns Have a great weekend!

## 2013-01-14 NOTE — Assessment & Plan Note (Signed)
Chronic problem, well controlled.  Asymptomatic. 

## 2013-01-14 NOTE — Assessment & Plan Note (Signed)
Pt now taking meds every other day due to side effects.  Check labs.  Adjust prn.

## 2013-01-14 NOTE — Assessment & Plan Note (Signed)
Pt's PE WNL.  Pt due for repeat colonoscopy.  Check labs.  Anticipatory guidance provided.

## 2013-01-14 NOTE — Assessment & Plan Note (Signed)
Pap collected. 

## 2013-01-19 ENCOUNTER — Telehealth: Payer: Self-pay | Admitting: Family Medicine

## 2013-01-19 NOTE — Telephone Encounter (Signed)
Patient states she is unable to log into MyChart and would like a copy of her recent labs mailed to her home address.

## 2013-01-19 NOTE — Telephone Encounter (Signed)
Mailed recent lab results to pt.//AB/CMA

## 2013-02-07 ENCOUNTER — Other Ambulatory Visit: Payer: Self-pay | Admitting: Family Medicine

## 2013-02-13 ENCOUNTER — Other Ambulatory Visit: Payer: Self-pay | Admitting: Family Medicine

## 2013-07-12 ENCOUNTER — Ambulatory Visit: Payer: 59 | Admitting: Family Medicine

## 2013-07-16 ENCOUNTER — Ambulatory Visit (INDEPENDENT_AMBULATORY_CARE_PROVIDER_SITE_OTHER): Payer: 59 | Admitting: Family Medicine

## 2013-07-16 ENCOUNTER — Encounter: Payer: Self-pay | Admitting: Family Medicine

## 2013-07-16 VITALS — BP 120/82 | HR 53 | Temp 98.3°F | Ht <= 58 in | Wt 143.4 lb

## 2013-07-16 DIAGNOSIS — I10 Essential (primary) hypertension: Secondary | ICD-10-CM

## 2013-07-16 DIAGNOSIS — K649 Unspecified hemorrhoids: Secondary | ICD-10-CM

## 2013-07-16 DIAGNOSIS — E785 Hyperlipidemia, unspecified: Secondary | ICD-10-CM

## 2013-07-16 LAB — HEPATIC FUNCTION PANEL
ALT: 28 U/L (ref 0–35)
Total Bilirubin: 0.5 mg/dL (ref 0.3–1.2)

## 2013-07-16 LAB — BASIC METABOLIC PANEL
BUN: 16 mg/dL (ref 6–23)
Chloride: 103 mEq/L (ref 96–112)
Creatinine, Ser: 0.8 mg/dL (ref 0.4–1.2)

## 2013-07-16 LAB — LIPID PANEL
Cholesterol: 163 mg/dL (ref 0–200)
Total CHOL/HDL Ratio: 3
Triglycerides: 219 mg/dL — ABNORMAL HIGH (ref 0.0–149.0)

## 2013-07-16 MED ORDER — DIBUCAINE 1 % EX OINT: TOPICAL_OINTMENT | Freq: Three times a day (TID) | CUTANEOUS | Status: DC | PRN

## 2013-07-16 MED ORDER — HYDROCORTISONE ACE-PRAMOXINE 2.5-1 % RE CREA: TOPICAL_CREAM | Freq: Three times a day (TID) | RECTAL | Status: DC

## 2013-07-16 NOTE — Assessment & Plan Note (Signed)
Chronic problem.  Excellent control.  Asymptomatic.  Check labs.  No anticipated changes. 

## 2013-07-16 NOTE — Progress Notes (Signed)
  Subjective:    Patient ID: Abigail Ellis, female    DOB: September 26, 1956, 57 y.o.   MRN: [id number]  HPI HTN- chronic problem, on HCTZ, metoprolol, norvasc.  Denies CP, SOB, HAs, visual changes, edema.  Hyperlipidemia- chronic problem, on Crestor.  Denies abd pain, N/V, myalgias.  Stopped Fenofibrate due to abd pain.  Hemorrhoid- pt has hx of similar, struggles w/ constipation intermittently.  Reports fenofibrate caused worsening sxs.  Now having pain, difficulty sitting, itching, and intermittent bleeding.   Review of Systems For ROS see HPI     Objective:   Physical Exam  Vitals reviewed. Constitutional: She is oriented to person, place, and time. She appears well-developed and well-nourished. No distress.  HENT:  Head: Normocephalic and atraumatic.  Eyes: Conjunctivae and EOM are normal. Pupils are equal, round, and reactive to light.  Neck: Normal range of motion. Neck supple. No thyromegaly present.  Cardiovascular: Normal rate, regular rhythm, normal heart sounds and intact distal pulses.   No murmur heard. Pulmonary/Chest: Effort normal and breath sounds normal. No respiratory distress.  Abdominal: Soft. She exhibits no distension. There is no tenderness.  Musculoskeletal: She exhibits no edema.  Lymphadenopathy:    She has no cervical adenopathy.  Neurological: She is alert and oriented to person, place, and time.  Skin: Skin is warm and dry.  Psychiatric: She has a normal mood and affect. Her behavior is normal.          Assessment & Plan:

## 2013-07-16 NOTE — Patient Instructions (Addendum)
Schedule your complete physical in 6 months Keep up the good work!!  You look great! We'll notify you of your lab results and make any changes if needed Call with any questions or concerns Have a great fall!!!

## 2013-07-16 NOTE — Assessment & Plan Note (Signed)
New to provider, hx of similar for pt.  Start nupercainal for sxs relief and analpram for healing.  Reviewed supportive care and red flags that should prompt return.  Pt expressed understanding and is in agreement w/ plan.

## 2013-07-16 NOTE — Assessment & Plan Note (Signed)
Chronic problem.  Tolerating statin but did not tolerate fenofibrate.  Check labs.  Adjust meds prn.

## 2013-07-24 ENCOUNTER — Other Ambulatory Visit: Payer: Self-pay | Admitting: Family Medicine

## 2013-08-02 ENCOUNTER — Telehealth: Payer: Self-pay | Admitting: Family Medicine

## 2013-08-02 NOTE — Telephone Encounter (Signed)
Patient Information:  Caller Name: Agness  Phone: [phone]  Patient: Abigail Ellis  Gender: Female  DOB: 1956-01-20  Age: 57 Years  PCP: Sheliah Hatch.  Office Follow Up:  Does the office need to follow up with this patient?: No  Instructions For The Office: N/A  RN Note:  pt does not want to come to office today; requesting an appt for tomorrow (due to work)  Symptoms  Reason For Call & Symptoms: abdominal pain.  Pt denies any nausea, vomiting or diarrhea.  Caller reports the cramps is intermittent.  Eating makes her pain worse.  Pt is worried she had H Pylori again.  Pt rates her pain as mild.  Reviewed Health History In EMR: Yes  Reviewed Medications In EMR: Yes  Reviewed Allergies In EMR: Yes  Reviewed Surgeries / Procedures: Yes  Date of Onset of Symptoms: 07/29/2013  Treatments Tried: Nexium and Tums  Treatments Tried Worked: No  Guideline(s) Used:  Abdominal Pain - Female  Disposition Per Guideline:   See Today in Office  Reason For Disposition Reached:   Moderate or mild pain that comes and goes (cramps) lasts > 24 hours  Advice Given:  N/A  Patient Refused Recommendation:  Patient Refused Care Advice  pt requesting appt for tomorrow.  On schedule, Dr Beverely Low had an opening at 1115 on 08/03/13, so RN scheduled appt.

## 2013-08-03 ENCOUNTER — Encounter: Payer: Self-pay | Admitting: Family Medicine

## 2013-08-03 ENCOUNTER — Ambulatory Visit (INDEPENDENT_AMBULATORY_CARE_PROVIDER_SITE_OTHER): Payer: 59 | Admitting: Family Medicine

## 2013-08-03 DIAGNOSIS — K2960 Other gastritis without bleeding: Secondary | ICD-10-CM | POA: Insufficient documentation

## 2013-08-03 DIAGNOSIS — K5901 Slow transit constipation: Secondary | ICD-10-CM | POA: Insufficient documentation

## 2013-08-03 NOTE — Assessment & Plan Note (Signed)
New.  Pt to take scheduled Nexium x7-10 days and add Tums as needed.  Reviewed supportive care and red flags that should prompt return.  Pt expressed understanding and is in agreement w/ plan.

## 2013-08-03 NOTE — Patient Instructions (Addendum)
This is gastritis due to the ibuprofen and constipation combo Continue the Nexium for 7-10 days, adding Tums as needed Take Miralax for the constipation Drink plenty of fluids Call if no improvement Hang in there! Good luck w/ back to school!!!

## 2013-08-03 NOTE — Progress Notes (Signed)
  Subjective:    Patient ID: Abigail Ellis, female    DOB: 03/23/1956, 57 y.o.   MRN: [id number]  HPI abd pain- was working in yard on Sunday and developed back pain.  Ate very light dinner and took 3 ibuprofen.  Since then, has had abd cramping, burning, nausea.  Some improvement w/ Nexium and tums.  Still some discomfort w/ eating.  + belching, increased gas.  + constipation.   Review of Systems For ROS see HPI     Objective:   Physical Exam  Vitals reviewed. Constitutional: She appears well-developed and well-nourished. No distress.  Abdominal: Soft. Bowel sounds are normal. She exhibits no distension and no mass. There is no tenderness. There is no rebound and no guarding.  Neurological: She is alert.  Skin: Skin is warm and dry.  Psychiatric: She has a normal mood and affect. Her behavior is normal.          Assessment & Plan:

## 2013-08-03 NOTE — Assessment & Plan Note (Signed)
New to provider, ongoing for pt.  Pt has Miralax available at home.  Start daily until regular BMs.  Reviewed supportive care and red flags that should prompt return.  Pt expressed understanding and is in agreement w/ plan.

## 2013-08-20 ENCOUNTER — Other Ambulatory Visit: Payer: Self-pay | Admitting: Family Medicine

## 2013-08-20 DIAGNOSIS — Z1231 Encounter for screening mammogram for malignant neoplasm of breast: Secondary | ICD-10-CM

## 2013-09-06 ENCOUNTER — Other Ambulatory Visit: Payer: Self-pay | Admitting: Family Medicine

## 2013-09-06 NOTE — Telephone Encounter (Signed)
Rx filled

## 2013-09-11 ENCOUNTER — Ambulatory Visit (HOSPITAL_COMMUNITY): Payer: 59 | Attending: Family Medicine

## 2013-10-09 ENCOUNTER — Ambulatory Visit (HOSPITAL_COMMUNITY)
Admission: RE | Admit: 2013-10-09 | Discharge: 2013-10-09 | Disposition: A | Discharge status: Home / Self Care | Payer: 59 | Source: Ambulatory Visit | Attending: Family Medicine | Admitting: Family Medicine

## 2013-10-09 DIAGNOSIS — Z1231 Encounter for screening mammogram for malignant neoplasm of breast: Secondary | ICD-10-CM

## 2013-10-18 ENCOUNTER — Other Ambulatory Visit: Payer: Self-pay

## 2013-11-05 ENCOUNTER — Other Ambulatory Visit: Payer: Self-pay | Admitting: Physician Assistant

## 2013-12-07 ENCOUNTER — Other Ambulatory Visit: Payer: Self-pay | Admitting: Family Medicine

## 2013-12-07 NOTE — Telephone Encounter (Signed)
Med filled.  

## 2014-01-02 ENCOUNTER — Other Ambulatory Visit: Payer: Self-pay | Admitting: Family Medicine

## 2014-01-02 NOTE — Telephone Encounter (Signed)
Med filled, pt has CPE in feb.

## 2014-01-03 ENCOUNTER — Emergency Department (HOSPITAL_COMMUNITY)
Admission: EM | Admit: 2014-01-03 | Discharge: 2014-01-03 | Disposition: A | Discharge status: Home / Self Care | Payer: 59 | Attending: Emergency Medicine | Admitting: Emergency Medicine

## 2014-01-03 ENCOUNTER — Emergency Department (HOSPITAL_COMMUNITY): Payer: 59

## 2014-01-03 ENCOUNTER — Encounter (HOSPITAL_COMMUNITY): Payer: Self-pay | Admitting: Emergency Medicine

## 2014-01-03 DIAGNOSIS — R5383 Other fatigue: Secondary | ICD-10-CM | POA: Insufficient documentation

## 2014-01-03 DIAGNOSIS — Z7982 Long term (current) use of aspirin: Secondary | ICD-10-CM | POA: Insufficient documentation

## 2014-01-03 DIAGNOSIS — H5500 Unspecified nystagmus: Secondary | ICD-10-CM | POA: Insufficient documentation

## 2014-01-03 DIAGNOSIS — R42 Dizziness and giddiness: Secondary | ICD-10-CM

## 2014-01-03 DIAGNOSIS — IMO0002 Reserved for concepts with insufficient information to code with codable children: Secondary | ICD-10-CM | POA: Insufficient documentation

## 2014-01-03 DIAGNOSIS — I10 Essential (primary) hypertension: Secondary | ICD-10-CM | POA: Insufficient documentation

## 2014-01-03 DIAGNOSIS — F17200 Nicotine dependence, unspecified, uncomplicated: Secondary | ICD-10-CM | POA: Insufficient documentation

## 2014-01-03 DIAGNOSIS — Z862 Personal history of diseases of the blood and blood-forming organs and certain disorders involving the immune mechanism: Secondary | ICD-10-CM | POA: Insufficient documentation

## 2014-01-03 DIAGNOSIS — Z79899 Other long term (current) drug therapy: Secondary | ICD-10-CM | POA: Insufficient documentation

## 2014-01-03 DIAGNOSIS — E785 Hyperlipidemia, unspecified: Secondary | ICD-10-CM | POA: Insufficient documentation

## 2014-01-03 DIAGNOSIS — R110 Nausea: Secondary | ICD-10-CM | POA: Insufficient documentation

## 2014-01-03 DIAGNOSIS — R5381 Other malaise: Secondary | ICD-10-CM | POA: Insufficient documentation

## 2014-01-03 LAB — COMPREHENSIVE METABOLIC PANEL
ALBUMIN: 4.2 g/dL (ref 3.5–5.2)
ALK PHOS: 78 U/L (ref 39–117)
ALT: 29 U/L (ref 0–35)
AST: 24 U/L (ref 0–37)
BUN: 16 mg/dL (ref 6–23)
CO2: 28 mEq/L (ref 19–32)
Calcium: 9.1 mg/dL (ref 8.4–10.5)
Chloride: 101 mEq/L (ref 96–112)
Creatinine, Ser: 0.68 mg/dL (ref 0.50–1.10)
GFR calc Af Amer: >90 mL/min (ref >90)
GFR calc non Af Amer: >90 mL/min (ref >90)
GLUCOSE: 101 mg/dL — AB (ref 70–99)
Potassium: 3.5 mEq/L — ABNORMAL LOW (ref 3.7–5.3)
SODIUM: 140 meq/L (ref 137–147)
TOTAL PROTEIN: 7.6 g/dL (ref 6.0–8.3)
Total Bilirubin: 0.3 mg/dL (ref 0.3–1.2)

## 2014-01-03 LAB — URINALYSIS, ROUTINE W REFLEX MICROSCOPIC
BILIRUBIN URINE: NEGATIVE
Glucose, UA: NEGATIVE mg/dL
Hgb urine dipstick: NEGATIVE
Ketones, ur: NEGATIVE mg/dL
Leukocytes, UA: NEGATIVE
Nitrite: NEGATIVE
PH: 7.5 (ref 5.0–8.0)
Protein, ur: NEGATIVE mg/dL
Specific Gravity, Urine: 1.008 (ref 1.005–1.030)
Urobilinogen, UA: 0.2 mg/dL (ref 0.0–1.0)

## 2014-01-03 LAB — CBC WITH DIFFERENTIAL/PLATELET
BASOS ABS: 0 10*3/uL (ref 0.0–0.1)
Basophils Relative: 0 % (ref 0–1)
EOS PCT: 2 % (ref 0–5)
Eosinophils Absolute: 0.1 10*3/uL (ref 0.0–0.7)
HCT: 38.2 % (ref 36.0–46.0)
Hemoglobin: 12.2 g/dL (ref 12.0–15.0)
Lymphocytes Relative: 23 % (ref 12–46)
Lymphs Abs: 1.6 10*3/uL (ref 0.7–4.0)
MCH: 20.4 pg — ABNORMAL LOW (ref 26.0–34.0)
MCHC: 31.9 g/dL (ref 30.0–36.0)
MCV: 63.9 fL — ABNORMAL LOW (ref 78.0–100.0)
MONOS PCT: 7 % (ref 3–12)
Monocytes Absolute: 0.5 10*3/uL (ref 0.1–1.0)
NEUTROS ABS: 4.7 10*3/uL (ref 1.7–7.7)
Neutrophils Relative %: 68 % (ref 43–77)
Platelets: 214 10*3/uL (ref 150–400)
RBC: 5.98 MIL/uL — ABNORMAL HIGH (ref 3.87–5.11)
RDW: 15.9 % — AB (ref 11.5–15.5)
WBC: 6.9 10*3/uL (ref 4.0–10.5)

## 2014-01-03 MED ORDER — MECLIZINE HCL 25 MG PO TABS
25.0000 mg | ORAL_TABLET | Freq: Once | ORAL | Status: AC
  Administered 2014-01-03: 25 mg via ORAL
  Filled 2014-01-03: qty 1

## 2014-01-03 MED ORDER — SODIUM CHLORIDE 0.9 % IV BOLUS (SEPSIS)
1000.0000 mL | Freq: Once | INTRAVENOUS | Status: AC
  Administered 2014-01-03: 1000 mL via INTRAVENOUS

## 2014-01-03 MED ORDER — MECLIZINE HCL 25 MG PO TABS: 25.0000 mg | ORAL_TABLET | Freq: Three times a day (TID) | ORAL | Status: DC | PRN

## 2014-01-03 NOTE — ED Notes (Signed)
Bed: WA09 Expected date:  Expected time:  Means of arrival:  Comments: EMS syncopal

## 2014-01-03 NOTE — ED Provider Notes (Signed)
CSN: [id number]     Arrival date & time 01/03/14  1206 History   First MD Initiated Contact with Patient 01/03/14 1218     Chief Complaint  Patient presents with  . Loss of Consciousness   (Consider location/radiation/quality/duration/timing/severity/associated sxs/prior Treatment) HPI Comments: 58 yo female with hx of anemia, HTN, hyperlipidemia, migraine presents with c/o dizziness x 2 days, worsening today at work. She reports having to sit on the floor due to the severity of symptoms, but did not have LOC, no syncope. Dizziness is described as lightheadedness, worse with ambulation and movement, including head movement, and accompanied by nausea without vomiting and fatigue. Reports 2 children at home with viral URI. Denies fever, chills, headache, chest pain, diarrhea, vaginal bleeding, rectal bleeding, numbness, room spinning, gait disturbance, visual disturbance.   The history is provided by the patient.    Past Medical History  Diagnosis Date  . Hypertension   . Anemia   . Hyperlipidemia   . Migraines    No past surgical history on file. Family History  Problem Relation Age of Onset  . Hypertension Mother   . Heart disease Mother   . Hypertension Father   . Heart disease Father   . Cancer Maternal Uncle     colon cancer  . Cancer Maternal Aunt     bladder   History  Substance Use Topics  . Smoking status: Current Some Day Smoker -- 0.25 packs/day for 10 years    Types: Cigarettes  . Smokeless tobacco: Not on file  . Alcohol Use: No   OB History   Grav Para Term Preterm Abortions TAB SAB Ect Mult Living                 Review of Systems  Constitutional: Positive for fatigue. Negative for fever and chills.  HENT: Negative for rhinorrhea and sore throat.   Eyes: Negative for visual disturbance.  Respiratory: Negative for cough and shortness of breath.   Cardiovascular: Negative for chest pain.  Gastrointestinal: Positive for nausea. Negative for vomiting and  abdominal pain.  Genitourinary: Negative for difficulty urinating.  Musculoskeletal: Negative for back pain.  Skin: Negative for rash.  Neurological: Positive for dizziness, weakness and light-headedness. Negative for tremors, seizures, syncope, facial asymmetry, speech difficulty, numbness and headaches.  Hematological: Negative for adenopathy.  Psychiatric/Behavioral: Negative for agitation.    Allergies  Statins  Home Medications   Current Outpatient Rx  Name  Route  Sig  Dispense  Refill  . aspirin 81 MG tablet   Oral   Take 81 mg by mouth daily.           Marland Kitchen BIOTIN PO   Oral   Take by mouth daily.           . Cholecalciferol (VITAMIN D-3 PO)   Oral   Take 1 tablet by mouth daily.         . CRESTOR 10 MG tablet      TAKE 1 TABLET AT BEDTIME   30 tablet   6   . CRESTOR 10 MG tablet      TAKE 1 TABLET AT BEDTIME   30 tablet   6   . dibucaine (NUPERCAINAL) 1 % ointment   Topical   Apply topically 3 (three) times daily as needed for pain.   30 g   0   . hydrochlorothiazide (HYDRODIURIL) 12.5 MG tablet      TAKE 1 TABLET (12.5 MG TOTAL) BY MOUTH DAILY.   30  tablet   6   . hydrochlorothiazide (HYDRODIURIL) 12.5 MG tablet      TAKE 1 TABLET (12.5 MG TOTAL) BY MOUTH DAILY.   30 tablet   6   . hydrocortisone-pramoxine (ANALPRAM-HC) 2.5-1 % rectal cream   Rectal   Place rectally 3 (three) times daily.   30 g   0   . NORVASC 10 MG tablet      TAKE 1 TABLET (10 MG TOTAL) BY MOUTH DAILY.   30 tablet   6     Dispense as written.   . TOPROL XL 50 MG 24 hr tablet      TAKE 1 TABLET (50 MG TOTAL) BY MOUTH DAILY.   30 tablet   6     Dispense as written.   . TOPROL XL 50 MG 24 hr tablet      TAKE 1 TABLET (50 MG TOTAL) BY MOUTH DAILY.   30 tablet   6     Dispense as written.    BP 153/89  Pulse 67  Ht 4\' 11"  (1.499 m)  Wt 138 lb (62.596 kg)  BMI 27.86 kg/m2  SpO2 100% Physical Exam  Nursing note and vitals  reviewed. Constitutional: She is oriented to person, place, and time. She appears well-developed and well-nourished.  HENT:  Head: Normocephalic and atraumatic.  Right Ear: External ear normal.  Left Ear: External ear normal.  Eyes: EOM are normal.  Mild nystagmus  Neck: Normal range of motion. Neck supple.  Cardiovascular: Normal rate, regular rhythm, normal heart sounds and intact distal pulses.   Pulmonary/Chest: Effort normal and breath sounds normal.  Abdominal: Soft. Bowel sounds are normal. There is no tenderness.  Musculoskeletal: Normal range of motion. She exhibits no edema.  Lymphadenopathy:    She has no cervical adenopathy.  Neurological: She is alert and oriented to person, place, and time. She has normal strength. No cranial nerve deficit or sensory deficit. Coordination and gait normal.  Skin: Skin is warm and dry. No pallor.  Psychiatric: She has a normal mood and affect.    ED Course  Procedures (including critical care time) Labs Review Labs Reviewed  CBC WITH DIFFERENTIAL - Abnormal; Notable for the following:    RBC 5.98 (*)    MCV 63.9 (*)    MCH 20.4 (*)    RDW 15.9 (*)    All other components within normal limits  COMPREHENSIVE METABOLIC PANEL - Abnormal; Notable for the following:    Potassium 3.5 (*)    Glucose, Bld 101 (*)    All other components within normal limits  URINALYSIS, ROUTINE W REFLEX MICROSCOPIC - Abnormal; Notable for the following:    APPearance CLOUDY (*)    All other components within normal limits   Imaging Review Dg Chest 2 View  01/03/2014   CLINICAL DATA:  Loss of consciousness  EXAM: CHEST  2 VIEW  COMPARISON:  None.  FINDINGS: Cardiomediastinal silhouette is unremarkable. No acute infiltrate or pleural effusion. No pulmonary edema. Bony thorax is unremarkable.  IMPRESSION: No active cardiopulmonary disease.   Electronically Signed   By: Lahoma Crocker M.D.   On: 01/03/2014 13:26    EKG Interpretation    Date/Time:  Thursday  January 03 2014 12:19:43 EST Ventricular Rate:  66 PR Interval:  219 QRS Duration: 97 QT Interval:  433 QTC Calculation: 454 R Axis:   -38 Text Interpretation:  Sinus rhythm Prolonged PR interval Probable left atrial enlargement S1,S2,S3 pattern Confirmed by WARD  DO, KRISTEN (  [id number]) on 01/03/2014 12:31:51 PM            MDM   1. Vertigo    58 yo female presents with dizziness and nausea starting yesterday, worse today. Neuro exam reveals no focal deficits. Gait is steady without disturbance, doubt central neurologic process, no emergent neuroimaging indicated. Labs reveal stable H&H, doubt anemia. CXR neg, no pneumonia. EKG reveals NSR with no acute ischemic changes. No report of chest pain, exertional fatigue/dyspnea. BP stable ranging 130's during ED evaluation. Positive Harland Dingwall per exam. After meclizine and IV hydration, patient feeling much better. Suspect peripheral vertigo and mild dehydration. Recommend meclizine, close f/u with PCP. Strict return instructions discussed and provided to the patient in writing at time of d/c.     Liliane Bade, NP 01/03/14 1555

## 2014-01-03 NOTE — Discharge Instructions (Signed)
Dizziness Dizziness is a common problem. It is a feeling of unsteadiness or lightheadedness. You may feel like you are about to faint. Dizziness can lead to injury if you stumble or fall. A person of any age group can suffer from dizziness, but dizziness is more common in older adults. CAUSES  Dizziness can be caused by many different things, including:  Middle ear problems.  Standing for too long.  Infections.  An allergic reaction.  Aging.  An emotional response to something, such as the sight of blood.  Side effects of medicines.  Fatigue.  Problems with circulation or blood pressure.  Excess use of alcohol, medicines, or illegal drug use.  Breathing too fast (hyperventilation).  An arrhythmia or problems with your heart rhythm.  Low red blood cell count (anemia).  Pregnancy.  Vomiting, diarrhea, fever, or other illnesses that cause dehydration.  Diseases or conditions such as Parkinson's disease, high blood pressure (hypertension), diabetes, and thyroid problems.  Exposure to extreme heat. DIAGNOSIS  To find the cause of your dizziness, your caregiver may do a physical exam, lab tests, radiologic imaging scans, or an electrocardiography test (ECG).  TREATMENT  Treatment of dizziness depends on the cause of your symptoms and can vary greatly. HOME CARE INSTRUCTIONS   Drink enough fluids to keep your urine clear or pale yellow. This is especially important in very hot weather. In the elderly, it is also important in cold weather.  If your dizziness is caused by medicines, take them exactly as directed. When taking blood pressure medicines, it is especially important to get up slowly.  Rise slowly from chairs and steady yourself until you feel okay.  In the morning, first sit up on the side of the bed. When this seems okay, stand slowly while holding onto something until you know your balance is fine.  If you need to stand in one place for a long time, be sure to  move your legs often. Tighten and relax the muscles in your legs while standing.  If dizziness continues to be a problem, have someone stay with you for a day or two. Do this until you feel you are well enough to stay alone. Have the person call your caregiver if he or she notices changes in you that are concerning.  Do not drive or use heavy machinery if you feel dizzy.  Do not drink alcohol. SEEK IMMEDIATE MEDICAL CARE IF:   Your dizziness or lightheadedness gets worse.  You feel nauseous or vomit.  You develop problems with talking, walking, weakness, or using your arms, hands, or legs.  You are not thinking clearly or you have difficulty forming sentences. It may take a friend or family member to determine if your thinking is normal.  You develop chest pain, abdominal pain, shortness of breath, or sweating.  Your vision changes.  You notice any bleeding.  You have side effects from medicine that seems to be getting worse rather than better. MAKE SURE YOU:   Understand these instructions.  Will watch your condition.  Will get help right away if you are not doing well or get worse. Document Released: 05/25/2001 Document Revised: 02/21/2012 Document Reviewed: 06/18/2011 Exit[employer] Patient Information 2014 [employer]. Benign Positional Vertigo Vertigo means you feel like you or your surroundings are moving when they are not. Benign positional vertigo is the most common form of vertigo. Benign means that the cause of your condition is not serious. Benign positional vertigo is more common in older adults. CAUSES    Benign positional vertigo is the result of an upset in the labyrinth system. This is an area in the middle ear that helps control your balance. This may be caused by a viral infection, head injury, or repetitive motion. However, often no specific cause is found. SYMPTOMS  Symptoms of benign positional vertigo occur when you move your head or eyes in different  directions. Some of the symptoms may include:  Loss of balance and falls.  Vomiting.  Blurred vision.  Dizziness.  Nausea.  Involuntary eye movements (nystagmus). DIAGNOSIS  Benign positional vertigo is usually diagnosed by physical exam. If the specific cause of your benign positional vertigo is unknown, your caregiver may perform imaging tests, such as magnetic resonance imaging (MRI) or computed tomography (CT). TREATMENT  Your caregiver may recommend movements or procedures to correct the benign positional vertigo. Medicines such as meclizine, benzodiazepines, and medicines for nausea may be used to treat your symptoms. In rare cases, if your symptoms are caused by certain conditions that affect the inner ear, you may need surgery. HOME CARE INSTRUCTIONS   Follow your caregiver's instructions.  Move slowly. Do not make sudden body or head movements.  Avoid driving.  Avoid operating heavy machinery.  Avoid performing any tasks that would be dangerous to you or others during a vertigo episode.  Drink enough fluids to keep your urine clear or pale yellow. SEEK IMMEDIATE MEDICAL CARE IF:   You develop problems with walking, weakness, numbness, or using your arms, hands, or legs.  You have difficulty speaking.  You develop severe headaches.  Your nausea or vomiting continues or gets worse.  You develop visual changes.  Your family or friends notice any behavioral changes.  Your condition gets worse.  You have a fever.  You develop a stiff neck or sensitivity to light. MAKE SURE YOU:   Understand these instructions.  Will watch your condition.  Will get help right away if you are not doing well or get worse. Document Released: 09/06/2006 Document Revised: 02/21/2012 Document Reviewed: 08/19/2011 [employer] Patient Information 2014 [employer].  

## 2014-01-03 NOTE — ED Provider Notes (Signed)
Medical screening examination/treatment/procedure(s) were performed by non-physician practitioner and as supervising physician I was immediately available for consultation/collaboration.  EKG Interpretation    Date/Time:  Thursday January 03 2014 12:19:43 EST Ventricular Rate:  66 PR Interval:  219 QRS Duration: 97 QT Interval:  433 QTC Calculation: 454 R Axis:   -38 Text Interpretation:  Sinus rhythm Prolonged PR interval Probable left atrial enlargement S1,S2,S3 pattern Confirmed by WARD  DO, KRISTEN ([id number]) on 01/03/2014 12:31:51 PM              Friendly, DO 01/03/14 1641

## 2014-01-03 NOTE — ED Notes (Signed)
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  Co-workers assisted her to the floor as she passed out from a sitting position.  She did not hit head but was unconscious for approx 1 minute per co-workers.  Per EMS: BP 90/p initially, 150/100 just prior to arrival to ED.  CBG 94.  HR 60 w/1degree block by EMS EKG.  On arrival here pt denies pain of any kind but c/o feeling shaky, nauseated and dizzy.

## 2014-01-09 ENCOUNTER — Telehealth: Payer: Self-pay

## 2014-01-09 NOTE — Telephone Encounter (Signed)
Medication and allergies:  Reviewed and updated  90 day supply/mail order: n/a Local pharmacy:  [facility] on [address]   Immunizations due:  UTD   A/P: No changes to personal, family history or past surgical hx PAP- 01/12/13-negative CCS- 07/20/13-diverticulosis and hemorrhoids; Recommended repeat in 7 years. MMG- 10/09/13-negative Flu- 10/2013 per pt. Tdap- 11/23/06   To Discuss with Provider: Not at this time.

## 2014-01-14 ENCOUNTER — Encounter: Payer: Self-pay | Admitting: Family Medicine

## 2014-01-14 ENCOUNTER — Ambulatory Visit (INDEPENDENT_AMBULATORY_CARE_PROVIDER_SITE_OTHER): Payer: 59 | Admitting: Family Medicine

## 2014-01-14 VITALS — BP 120/78 | HR 69 | Temp 98.4°F | Resp 16 | Ht 59.0 in | Wt 145.1 lb

## 2014-01-14 DIAGNOSIS — E2839 Other primary ovarian failure: Secondary | ICD-10-CM

## 2014-01-14 DIAGNOSIS — Z0000 Encounter for general adult medical examination without abnormal findings: Secondary | ICD-10-CM

## 2014-01-14 DIAGNOSIS — Z01419 Encounter for gynecological examination (general) (routine) without abnormal findings: Secondary | ICD-10-CM

## 2014-01-14 LAB — CBC WITH DIFFERENTIAL/PLATELET
Basophils Absolute: 0 10*3/uL (ref 0.0–0.1)
Basophils Relative: 0.5 % (ref 0.0–3.0)
EOS ABS: 0.1 10*3/uL (ref 0.0–0.7)
Eosinophils Relative: 1.1 % (ref 0.0–5.0)
HEMATOCRIT: 38.7 % (ref 36.0–46.0)
HEMOGLOBIN: 11.8 g/dL — AB (ref 12.0–15.0)
LYMPHS ABS: 1.4 10*3/uL (ref 0.7–4.0)
Lymphocytes Relative: 22.2 % (ref 12.0–46.0)
MCHC: 30.5 g/dL (ref 30.0–36.0)
MCV: 67 fl — ABNORMAL LOW (ref 78.0–100.0)
Monocytes Absolute: 0.4 10*3/uL (ref 0.1–1.0)
Monocytes Relative: 7.1 % (ref 3.0–12.0)
NEUTROS ABS: 4.2 10*3/uL (ref 1.4–7.7)
Neutrophils Relative %: 69.1 % (ref 43.0–77.0)
Platelets: 236 10*3/uL (ref 150.0–400.0)
RBC: 5.77 Mil/uL — ABNORMAL HIGH (ref 3.87–5.11)
RDW: 16.3 % — ABNORMAL HIGH (ref 11.5–14.6)
WBC: 6.1 10*3/uL (ref 4.5–10.5)

## 2014-01-14 LAB — HEPATIC FUNCTION PANEL
ALBUMIN: 4.2 g/dL (ref 3.5–5.2)
ALT: 26 U/L (ref 0–35)
AST: 23 U/L (ref 0–37)
Alkaline Phosphatase: 58 U/L (ref 39–117)
Bilirubin, Direct: 0 mg/dL (ref 0.0–0.3)
Total Bilirubin: 0.7 mg/dL (ref 0.3–1.2)
Total Protein: 7.3 g/dL (ref 6.0–8.3)

## 2014-01-14 LAB — BASIC METABOLIC PANEL
BUN: 16 mg/dL (ref 6–23)
CHLORIDE: 102 meq/L (ref 96–112)
CO2: 30 mEq/L (ref 19–32)
CREATININE: 0.7 mg/dL (ref 0.4–1.2)
Calcium: 9.4 mg/dL (ref 8.4–10.5)
GFR: 86.93 mL/min (ref >60.00)
Glucose, Bld: 99 mg/dL (ref 70–99)
Potassium: 3.5 mEq/L (ref 3.5–5.1)
Sodium: 141 mEq/L (ref 135–145)

## 2014-01-14 LAB — LIPID PANEL
CHOLESTEROL: 171 mg/dL (ref 0–200)
HDL: 62 mg/dL (ref >39.00)
LDL Cholesterol: 75 mg/dL (ref 0–99)
Total CHOL/HDL Ratio: 3
Triglycerides: 168 mg/dL — ABNORMAL HIGH (ref 0.0–149.0)
VLDL: 33.6 mg/dL (ref 0.0–40.0)

## 2014-01-14 LAB — TSH: TSH: 0.64 u[IU]/mL (ref 0.35–5.50)

## 2014-01-14 NOTE — Progress Notes (Signed)
Pre visit review using our clinic review tool, if applicable. No additional management support is needed unless otherwise documented below in the visit note. 

## 2014-01-14 NOTE — Progress Notes (Signed)
   Subjective:    Patient ID: Abigail Ellis, female    DOB: [birth year], 58 y.o.   MRN: [id number]  HPI CPE- UTD on colonoscopy, pap, mammo.  Due for DEXA.  Pt went to ER for syncopal episode at work.  Pt has hx of similar when overtired.  Develops vertigo.  Things improved in ER w/ rest, fluids, and meclizine.  Currently feeling well.   Review of Systems Patient reports no vision/ hearing changes, adenopathy,fever, weight change,  persistant/recurrent hoarseness , swallowing issues, chest pain, palpitations, edema, persistant/recurrent cough, hemoptysis, dyspnea (rest/exertional/paroxysmal nocturnal), gastrointestinal bleeding (melena, rectal bleeding), abdominal pain, significant heartburn, bowel changes, GU symptoms (dysuria, hematuria, incontinence), Gyn symptoms (abnormal bleeding, pain), focal weakness, memory loss, numbness & tingling, skin/hair/nail changes, abnormal bruising or bleeding, anxiety, or depression.  + syncope x1 + hemorrhoids     Objective:   Physical Exam General Appearance:    Alert, cooperative, no distress, appears stated age  Head:    Normocephalic, without obvious abnormality, atraumatic  Eyes:    PERRL, conjunctiva/corneas clear, EOM's intact, fundi    benign, both eyes  Ears:    Normal TM's and external ear canals, both ears  Nose:   Nares normal, septum midline, mucosa normal, no drainage    or sinus tenderness  Throat:   Lips, mucosa, and tongue normal; teeth and gums normal  Neck:   Supple, symmetrical, trachea midline, no adenopathy;    Thyroid: no enlargement/tenderness/nodules  Back:     Symmetric, no curvature, ROM normal, no CVA tenderness  Lungs:     Clear to auscultation bilaterally, respirations unlabored  Chest Wall:    No tenderness or deformity   Heart:    Regular rate and rhythm, S1 and S2 normal, no murmur, rub   or gallop  Breast Exam:    Deferred to mammo  Abdomen:     Soft, non-tender, bowel sounds active all four quadrants,    no  masses, no organomegaly  Genitalia:    Deferred to GYN  Rectal:    Extremities:   Extremities normal, atraumatic, no cyanosis or edema  Pulses:   2+ and symmetric all extremities  Skin:   Skin color, texture, turgor normal, no rashes or lesions  Lymph nodes:   Cervical, supraclavicular, and axillary nodes normal  Neurologic:   CNII-XII intact, normal strength, sensation and reflexes    throughout          Assessment & Plan:

## 2014-01-14 NOTE — Assessment & Plan Note (Signed)
Pt's PE WNL.  UTD on health maintenance w/ exception of DEXA.  Ordered entered.  Check labs.  Anticipatory guidance provided.

## 2014-01-14 NOTE — Patient Instructions (Signed)
Follow up in 6 months to recheck BP and cholesterol We'll notify you of your lab results and make any changes if needed Continue to make healthy food choices and get regular exercise Drink plenty of fluids to avoid passing out Call with any questions or concerns Happy Valentine's Day!

## 2014-01-16 ENCOUNTER — Telehealth: Payer: Self-pay | Admitting: Family Medicine

## 2014-01-16 NOTE — Telephone Encounter (Signed)
Relevant patient education assigned to patient using Emmi. ° °

## 2014-01-17 LAB — VITAMIN D 1,25 DIHYDROXY
VITAMIN D3 1, 25 (OH): 53 pg/mL
Vitamin D 1, 25 (OH)2 Total: 53 pg/mL (ref 18–72)
Vitamin D2 1, 25 (OH)2: <8 pg/mL

## 2014-01-21 ENCOUNTER — Ambulatory Visit (HOSPITAL_COMMUNITY)
Admission: RE | Admit: 2014-01-21 | Discharge: 2014-01-21 | Disposition: A | Discharge status: Home / Self Care | Payer: 59 | Source: Ambulatory Visit | Attending: Family Medicine | Admitting: Family Medicine

## 2014-01-21 DIAGNOSIS — Z780 Asymptomatic menopausal state: Secondary | ICD-10-CM | POA: Insufficient documentation

## 2014-01-21 DIAGNOSIS — E2839 Other primary ovarian failure: Secondary | ICD-10-CM

## 2014-01-21 DIAGNOSIS — Z13820 Encounter for screening for osteoporosis: Secondary | ICD-10-CM | POA: Insufficient documentation

## 2014-02-11 ENCOUNTER — Encounter: Payer: Self-pay | Admitting: General Practice

## 2014-03-28 ENCOUNTER — Ambulatory Visit (INDEPENDENT_AMBULATORY_CARE_PROVIDER_SITE_OTHER): Payer: 59 | Admitting: Family Medicine

## 2014-03-28 ENCOUNTER — Encounter: Payer: Self-pay | Admitting: Family Medicine

## 2014-03-28 VITALS — BP 132/80 | HR 68 | Temp 98.1°F | Resp 16 | Wt 149.8 lb

## 2014-03-28 DIAGNOSIS — R141 Gas pain: Secondary | ICD-10-CM

## 2014-03-28 DIAGNOSIS — R81 Glycosuria: Secondary | ICD-10-CM

## 2014-03-28 DIAGNOSIS — R6881 Early satiety: Secondary | ICD-10-CM | POA: Insufficient documentation

## 2014-03-28 DIAGNOSIS — R143 Flatulence: Secondary | ICD-10-CM

## 2014-03-28 DIAGNOSIS — R739 Hyperglycemia, unspecified: Secondary | ICD-10-CM | POA: Insufficient documentation

## 2014-03-28 DIAGNOSIS — R142 Eructation: Secondary | ICD-10-CM

## 2014-03-28 DIAGNOSIS — M549 Dorsalgia, unspecified: Secondary | ICD-10-CM

## 2014-03-28 DIAGNOSIS — R7309 Other abnormal glucose: Secondary | ICD-10-CM

## 2014-03-28 DIAGNOSIS — R140 Abdominal distension (gaseous): Secondary | ICD-10-CM

## 2014-03-28 DIAGNOSIS — K5901 Slow transit constipation: Secondary | ICD-10-CM

## 2014-03-28 LAB — BASIC METABOLIC PANEL
BUN: 14 mg/dL (ref 6–23)
CHLORIDE: 101 meq/L (ref 96–112)
CO2: 30 mEq/L (ref 19–32)
Calcium: 9.3 mg/dL (ref 8.4–10.5)
Creatinine, Ser: 1.1 mg/dL (ref 0.4–1.2)
GFR: 53.01 mL/min — AB (ref >60.00)
Glucose, Bld: 188 mg/dL — ABNORMAL HIGH (ref 70–99)
Potassium: 3.1 mEq/L — ABNORMAL LOW (ref 3.5–5.1)
SODIUM: 138 meq/L (ref 135–145)

## 2014-03-28 LAB — HEPATIC FUNCTION PANEL
ALBUMIN: 3.9 g/dL (ref 3.5–5.2)
ALT: 29 U/L (ref 0–35)
AST: 21 U/L (ref 0–37)
Alkaline Phosphatase: 59 U/L (ref 39–117)
Bilirubin, Direct: 0 mg/dL (ref 0.0–0.3)
Total Bilirubin: 0.6 mg/dL (ref 0.3–1.2)
Total Protein: 6.9 g/dL (ref 6.0–8.3)

## 2014-03-28 LAB — CBC WITH DIFFERENTIAL/PLATELET
Basophils Absolute: 0 10*3/uL (ref 0.0–0.1)
Basophils Relative: 0.4 % (ref 0.0–3.0)
EOS PCT: 1.1 % (ref 0.0–5.0)
Eosinophils Absolute: 0.1 10*3/uL (ref 0.0–0.7)
HCT: 37.7 % (ref 36.0–46.0)
Hemoglobin: 11.9 g/dL — ABNORMAL LOW (ref 12.0–15.0)
LYMPHS ABS: 1.3 10*3/uL (ref 0.7–4.0)
Lymphocytes Relative: 20.2 % (ref 12.0–46.0)
MCHC: 31.5 g/dL (ref 30.0–36.0)
MONOS PCT: 5.7 % (ref 3.0–12.0)
Monocytes Absolute: 0.4 10*3/uL (ref 0.1–1.0)
Neutro Abs: 4.5 10*3/uL (ref 1.4–7.7)
Neutrophils Relative %: 72.6 % (ref 43.0–77.0)
Platelets: 243 10*3/uL (ref 150.0–400.0)
RBC: 5.7 Mil/uL — AB (ref 3.87–5.11)
RDW: 16.5 % — ABNORMAL HIGH (ref 11.5–14.6)
WBC: 6.2 10*3/uL (ref 4.5–10.5)

## 2014-03-28 LAB — TSH: TSH: 0.57 u[IU]/mL (ref 0.35–5.50)

## 2014-03-28 LAB — HEMOGLOBIN A1C: HEMOGLOBIN A1C: 6.3 % (ref 4.6–6.5)

## 2014-03-28 LAB — POCT URINALYSIS DIPSTICK
Bilirubin, UA: NEGATIVE
Glucose, UA: 1000
Ketones, UA: NEGATIVE
LEUKOCYTES UA: NEGATIVE
Nitrite, UA: NEGATIVE
Protein, UA: NEGATIVE
SPEC GRAV UA: 1.02
Urobilinogen, UA: 0.2
pH, UA: 6

## 2014-03-28 LAB — GLUCOSE, POCT (MANUAL RESULT ENTRY): POC GLUCOSE: 198 mg/dL — AB (ref 70–99)

## 2014-03-28 MED ORDER — POLYETHYLENE GLYCOL 3350 17 GM/SCOOP PO POWD: 17.0000 g | Freq: Every day | ORAL | Status: DC

## 2014-03-28 NOTE — Assessment & Plan Note (Signed)
Recurrent problem for pt.  Start Miralax.  Will follow.

## 2014-03-28 NOTE — Patient Instructions (Addendum)
Follow up in 2-3 weeks to recheck symptoms We'll notify you of your lab results and make any changes if needed Increase your water intake Start Miralax daily to improve constipation Call with any questions or concerns- particularly if pain worsens Hang in there!!!

## 2014-03-28 NOTE — Progress Notes (Signed)
Pre visit review using our clinic review tool, if applicable. No additional management support is needed unless otherwise documented below in the visit note. 

## 2014-03-28 NOTE — Assessment & Plan Note (Signed)
New.  Check CA125.  Suspect this is due to increased gas and bloating.  Determine next steps based on lab results.

## 2014-03-28 NOTE — Progress Notes (Signed)
   Subjective:    Patient ID: Abigail Ellis, female    DOB: 1956/09/30, 58 y.o.   MRN: [id number]  Back Pain   abd distension- sxs started 2 weeks ago, denies abd pain.  + bloating.  Minimal passing gas, + belching.  No diarrhea.  + constipation.  Has been taking fiber daily.  Some nausea after eating.  No vomiting.  No fevers.  + early satiety  Low back pain- occurred in response to abdominal distention.  No dysuria.  No urinary frequency.  No increased thirst.  Hyperglycemia- pt w/ glucose in urine, CBG in office 198.  Ate rice just prior to arrival.   Review of Systems  Musculoskeletal: Positive for back pain.   For ROS see HPI     Objective:   Physical Exam  Vitals reviewed. Constitutional: She appears well-developed and well-nourished. No distress.  Cardiovascular: Normal rate, regular rhythm and normal heart sounds.   Pulmonary/Chest: Effort normal and breath sounds normal. No respiratory distress. She has no wheezes. She has no rales.  Abdominal: Soft. Bowel sounds are normal. She exhibits distension. She exhibits no mass (no hepatosplenomegaly). There is no tenderness. There is no rebound and no guarding.  Musculoskeletal: She exhibits no edema.          Assessment & Plan:

## 2014-03-28 NOTE — Assessment & Plan Note (Signed)
New.  Pt is clearly distended and uncomfortable.  May be due to constipation w/ gas and bloating from subsequent fiber use.  Given early satiety, will get CA125.  Denies abd pain.  Start miralax for constipation.  Check labs.

## 2014-03-28 NOTE — Assessment & Plan Note (Signed)
New.  Pt w/ sugar in urine and CBG in office was elevated.  Encouraged her to work on low carb diet.  Check A1C.  Start meds prn.

## 2014-03-29 LAB — CA 125: CA 125: 4.4 U/mL (ref 0.0–30.2)

## 2014-03-31 LAB — URINE CULTURE

## 2014-04-02 ENCOUNTER — Telehealth: Payer: Self-pay | Admitting: *Deleted

## 2014-04-02 MED ORDER — CEPHALEXIN 500 MG PO CAPS: ORAL_CAPSULE | ORAL | Status: DC

## 2014-04-02 MED ORDER — POTASSIUM CHLORIDE CRYS ER 20 MEQ PO TBCR: 20.0000 meq | EXTENDED_RELEASE_TABLET | Freq: Every day | ORAL | Status: DC

## 2014-04-02 NOTE — Telephone Encounter (Signed)
Spoke with the pt and informed her of recent lab results and note.  Pt understood and agreed.  Pt stated that her stomach still feels bloated,but alittle better than before.  She said she still is having lower abdominal and back pressure when she sits down,but it's getting alittle better.//AB/CMA

## 2014-04-02 NOTE — Telephone Encounter (Signed)
Verbally spoke with Dr. Birdie Riddle and informed her of note below from the pt.  Verbally per Dr. Birdie Riddle the pt's UC was negative,but we will have the pt take Keflex 500mg  BID x 5 days, and if she's still having sxs next week she's to give [place] a call back.  Spoke with the pt and informed her that the UC was less than 100,000 so it is negative,but Dr. Birdie Riddle wants to give her Keflex 500mg  BID x 5 days.  And if she still having the sxs next week she's to give [place] a call back.   Pt understood and agreed.  Pt stated she hopes this helps.  New rx for Keflex 500mg  BID x 5 days was sent to the pharmacy by e-script.//AB/CMA

## 2014-04-02 NOTE — Telephone Encounter (Signed)
Message copied by Harl Bowie on Tue Apr 02, 2014 10:20 AM ------      Message from: Midge Minium      Created: Tue Apr 02, 2014  8:09 AM       Labs look good w/ exception of elevated glucose and A1C that puts you in the Pre-diabetes range.  Based on this, you need to work on low carb diet and regular exercise.      Potassium is low at 3.1- please start [place] 5meq daily (script sent)      Remainder of labs look good!      How are you feeling? ------

## 2014-04-18 ENCOUNTER — Telehealth: Payer: Self-pay | Admitting: Family Medicine

## 2014-04-18 NOTE — Telephone Encounter (Signed)
Left pt a VM to reschedule apt on 5/8 @ 4:00

## 2014-04-19 ENCOUNTER — Ambulatory Visit: Payer: 59 | Admitting: Family Medicine

## 2014-05-02 ENCOUNTER — Ambulatory Visit (INDEPENDENT_AMBULATORY_CARE_PROVIDER_SITE_OTHER): Payer: 59 | Admitting: Family Medicine

## 2014-05-02 ENCOUNTER — Encounter: Payer: Self-pay | Admitting: Family Medicine

## 2014-05-02 ENCOUNTER — Other Ambulatory Visit: Payer: Self-pay | Admitting: General Practice

## 2014-05-02 VITALS — BP 130/84 | HR 73 | Temp 98.0°F | Resp 16 | Wt 148.4 lb

## 2014-05-02 DIAGNOSIS — N3281 Overactive bladder: Secondary | ICD-10-CM | POA: Insufficient documentation

## 2014-05-02 DIAGNOSIS — R140 Abdominal distension (gaseous): Secondary | ICD-10-CM

## 2014-05-02 DIAGNOSIS — N318 Other neuromuscular dysfunction of bladder: Secondary | ICD-10-CM

## 2014-05-02 DIAGNOSIS — R142 Eructation: Secondary | ICD-10-CM

## 2014-05-02 DIAGNOSIS — R143 Flatulence: Secondary | ICD-10-CM

## 2014-05-02 DIAGNOSIS — R141 Gas pain: Secondary | ICD-10-CM

## 2014-05-02 MED ORDER — POLYETHYLENE GLYCOL 3350 17 GM/SCOOP PO POWD: 17.0000 g | Freq: Every day | ORAL | Status: DC

## 2014-05-02 MED ORDER — TOLTERODINE TARTRATE ER 4 MG PO CP24: 4.0000 mg | ORAL_CAPSULE | Freq: Every day | ORAL | Status: DC

## 2014-05-02 NOTE — Patient Instructions (Signed)
Follow up as needed Start the Detrol LA daily for overactive bladder- if this doesn't improve your symptoms in the next month, call and we'll refer you to urology Call with any questions or concerns [name]!

## 2014-05-02 NOTE — Assessment & Plan Note (Signed)
New.  Pt reports months of sxs and 1 accident.  Start Detrol LA- coupon given.  If no improvement, will refer to urology.  Will follow.

## 2014-05-02 NOTE — Progress Notes (Signed)
   Subjective:    Patient ID: Abigail Ellis, female    DOB: 1956/02/28, 58 y.o.   MRN: [id number]  HPI abd pain- pain improved.  'i feel a lot better'.  No longer having pain, bloating or distention.  No longer feeling full after eating, no early satiety.  OAB- pt reports sxs started months ago.  Had 1 episode of incontinence.     Review of Systems For ROS see HPI     Objective:   Physical Exam  Vitals reviewed. Constitutional: She is oriented to person, place, and time. She appears well-developed and well-nourished. No distress.  HENT:  Head: Normocephalic and atraumatic.  Eyes: Conjunctivae and EOM are normal. Pupils are equal, round, and reactive to light.  Neck: Normal range of motion. Neck supple. No thyromegaly present.  Cardiovascular: Normal rate, regular rhythm, normal heart sounds and intact distal pulses.   No murmur heard. Pulmonary/Chest: Effort normal and breath sounds normal. No respiratory distress.  Abdominal: Soft. She exhibits no distension. There is no tenderness.  Musculoskeletal: She exhibits no edema.  Lymphadenopathy:    She has no cervical adenopathy.  Neurological: She is alert and oriented to person, place, and time.  Skin: Skin is warm and dry.  Psychiatric: She has a normal mood and affect. Her behavior is normal.          Assessment & Plan:

## 2014-05-02 NOTE — Progress Notes (Signed)
Pre visit review using our clinic review tool, if applicable. No additional management support is needed unless otherwise documented below in the visit note. 

## 2014-05-02 NOTE — Assessment & Plan Note (Signed)
Resolved since last visit.  Pt had subclinical (only 20,000 colonies) on Ucx and this was likely the cause of her sxs.  No need for additional evaluation.

## 2014-06-01 ENCOUNTER — Other Ambulatory Visit: Payer: Self-pay | Admitting: Family Medicine

## 2014-06-03 NOTE — Telephone Encounter (Signed)
Med filled.  

## 2014-07-15 ENCOUNTER — Ambulatory Visit: Payer: 59 | Admitting: Family Medicine

## 2014-07-17 ENCOUNTER — Ambulatory Visit (INDEPENDENT_AMBULATORY_CARE_PROVIDER_SITE_OTHER): Payer: 59 | Admitting: Family Medicine

## 2014-07-17 ENCOUNTER — Encounter: Payer: Self-pay | Admitting: Family Medicine

## 2014-07-17 VITALS — BP 128/84 | HR 74 | Temp 98.2°F | Resp 16 | Wt 149.1 lb

## 2014-07-17 DIAGNOSIS — I10 Essential (primary) hypertension: Secondary | ICD-10-CM

## 2014-07-17 DIAGNOSIS — E785 Hyperlipidemia, unspecified: Secondary | ICD-10-CM

## 2014-07-17 LAB — HEPATIC FUNCTION PANEL
ALK PHOS: 65 U/L (ref 39–117)
ALT: 24 U/L (ref 0–35)
AST: 21 U/L (ref 0–37)
Albumin: 4 g/dL (ref 3.5–5.2)
Bilirubin, Direct: 0 mg/dL (ref 0.0–0.3)
TOTAL PROTEIN: 6.8 g/dL (ref 6.0–8.3)
Total Bilirubin: 0.5 mg/dL (ref 0.2–1.2)

## 2014-07-17 LAB — LIPID PANEL
Cholesterol: 184 mg/dL (ref 0–200)
HDL: 52.8 mg/dL (ref >39.00)
LDL Cholesterol: 93 mg/dL (ref 0–99)
NONHDL: 131.2
TRIGLYCERIDES: 189 mg/dL — AB (ref 0.0–149.0)
Total CHOL/HDL Ratio: 3
VLDL: 37.8 mg/dL (ref 0.0–40.0)

## 2014-07-17 LAB — BASIC METABOLIC PANEL
BUN: 13 mg/dL (ref 6–23)
CALCIUM: 9.2 mg/dL (ref 8.4–10.5)
CO2: 30 meq/L (ref 19–32)
Chloride: 103 mEq/L (ref 96–112)
Creatinine, Ser: 0.7 mg/dL (ref 0.4–1.2)
GFR: 99.22 mL/min (ref >60.00)
Glucose, Bld: 93 mg/dL (ref 70–99)
Potassium: 3.9 mEq/L (ref 3.5–5.1)
SODIUM: 140 meq/L (ref 135–145)

## 2014-07-17 NOTE — Progress Notes (Signed)
Pre visit review using our clinic review tool, if applicable. No additional management support is needed unless otherwise documented below in the visit note. 

## 2014-07-17 NOTE — Progress Notes (Signed)
   Subjective:    Patient ID: Abigail Ellis, female    DOB: [birth year], 58 y.o.   MRN: [id number]  HPI HTN- chronic problem, well controlled on Amlodipine, HCTZ, metoprolol.  No CP, SOB, HAs, visual changes, edema.  Hyperlipidemia- chronic problem, on Crestor daily.  Denies abd pain, N/V, myalgias.   Review of Systems For ROS see HPI     Objective:   Physical Exam  Vitals reviewed. Constitutional: She is oriented to person, place, and time. She appears well-developed and well-nourished. No distress.  HENT:  Head: Normocephalic and atraumatic.  Eyes: Conjunctivae and EOM are normal. Pupils are equal, round, and reactive to light.  Neck: Normal range of motion. Neck supple. No thyromegaly present.  Cardiovascular: Normal rate, regular rhythm, normal heart sounds and intact distal pulses.   No murmur heard. Pulmonary/Chest: Effort normal and breath sounds normal. No respiratory distress.  Abdominal: Soft. She exhibits no distension. There is no tenderness.  Musculoskeletal: She exhibits no edema.  Lymphadenopathy:    She has no cervical adenopathy.  Neurological: She is alert and oriented to person, place, and time.  Skin: Skin is warm and dry.  Psychiatric: She has a normal mood and affect. Her behavior is normal.          Assessment & Plan:

## 2014-07-17 NOTE — Patient Instructions (Signed)
Schedule your complete physical in 6 months We'll notify you of your lab results and make any changes if needed Keep up the good work!  You look great! Continue to make healthy food choices and get regular exercise Call with any questions or concerns Enjoy the rest of your summer!!!

## 2014-07-17 NOTE — Assessment & Plan Note (Signed)
Chronic problem.  Tolerating statin w/o difficulty.  Check labs.  Adjust meds prn  

## 2014-07-17 NOTE — Assessment & Plan Note (Signed)
Chronic problem.  Well controlled.  Asymptomatic.  Check labs.  No anticipated med changes. 

## 2014-07-18 ENCOUNTER — Telehealth: Payer: Self-pay | Admitting: Family Medicine

## 2014-07-18 NOTE — Telephone Encounter (Signed)
Relevant patient education assigned to patient using Emmi. ° °

## 2014-09-17 ENCOUNTER — Encounter: Payer: Self-pay | Admitting: Medical

## 2014-09-17 ENCOUNTER — Ambulatory Visit (HOSPITAL_BASED_OUTPATIENT_CLINIC_OR_DEPARTMENT_OTHER)
Admission: RE | Admit: 2014-09-17 | Discharge: 2014-09-17 | Disposition: A | Discharge status: Home / Self Care | Payer: 59 | Source: Ambulatory Visit | Attending: Medical | Admitting: Medical

## 2014-09-17 ENCOUNTER — Ambulatory Visit (INDEPENDENT_AMBULATORY_CARE_PROVIDER_SITE_OTHER): Payer: 59 | Admitting: Medical

## 2014-09-17 VITALS — BP 153/94 | HR 73 | Temp 98.8°F | Ht <= 58 in | Wt 152.2 lb

## 2014-09-17 DIAGNOSIS — K5900 Constipation, unspecified: Secondary | ICD-10-CM

## 2014-09-17 DIAGNOSIS — R1030 Lower abdominal pain, unspecified: Secondary | ICD-10-CM

## 2014-09-17 DIAGNOSIS — R109 Unspecified abdominal pain: Secondary | ICD-10-CM | POA: Insufficient documentation

## 2014-09-17 DIAGNOSIS — M549 Dorsalgia, unspecified: Secondary | ICD-10-CM

## 2014-09-17 LAB — POCT URINALYSIS DIPSTICK
BILIRUBIN UA: NEGATIVE
Glucose, UA: NEGATIVE
Ketones, UA: NEGATIVE
LEUKOCYTES UA: NEGATIVE
Nitrite, UA: NEGATIVE
RBC UA: NEGATIVE
Spec Grav, UA: 1.015
UROBILINOGEN UA: 0.2
pH, UA: 6

## 2014-09-17 MED ORDER — CIPROFLOXACIN HCL 250 MG PO TABS: 250.0000 mg | ORAL_TABLET | Freq: Two times a day (BID) | ORAL | Status: DC

## 2014-09-17 NOTE — Assessment & Plan Note (Signed)
Pain in lower abdomen likely related to constipation and abdominal distension. Pt is convinced has uti. I will do culture of urine. If she gets urinary symptoms pending urine culture start cipro 250 mg bid x 3 days.

## 2014-09-17 NOTE — Progress Notes (Signed)
Subjective:    Patient ID: Abigail Ellis, female    DOB: [birth year], 58 y.o.   MRN: [id number]  HPI  Pt in with some abdomen pain and some lower back pain. Started on Sunday. Stated began with urge to urinate. Then she developed suprapubic pain. No bowel movement for 2 days. Pain reports some back pain. Reports some chills but no fever. No diarrhea. Pt has not hx of abdominal surgeries before. She does feel mild bloated.  She states early this am when urinated she had mild pain. No nausea or vomiting report.  Past Medical History  Diagnosis Date  . Hypertension   . Anemia   . Hyperlipidemia   . Migraines     History   Social History  . Marital Status: Married    Spouse Name: N/A    Number of Children: N/A  . Years of Education: N/A   Occupational History  . Not on file.   Social History Main Topics  . Smoking status: Current Some Day Smoker -- 0.25 packs/day for 10 years    Types: Cigarettes  . Smokeless tobacco: Not on file  . Alcohol Use: No  . Drug Use: No  . Sexual Activity: Not Currently   Other Topics Concern  . Not on file   Social History Narrative  . No narrative on file    No past surgical history on file.  Family History  Problem Relation Age of Onset  . Hypertension Mother   . Heart disease Mother   . Hypertension Father   . Heart disease Father   . Cancer Maternal Uncle     colon cancer  . Cancer Maternal Aunt     bladder    Allergies  Allergen Reactions  . Zocor [Simvastatin] Other (See Comments)    Severe muscle pain and joint pain     Current Outpatient Prescriptions on File Prior to Visit  Medication Sig Dispense Refill  . amLODipine (NORVASC) 10 MG tablet Take 10 mg by mouth daily.      Marland Kitchen aspirin 325 MG tablet Take 325 mg by mouth daily.      Marland Kitchen BIOTIN PO Take by mouth daily.        . Cholecalciferol (VITAMIN D-3 PO) Take 1 tablet by mouth daily.      . hydrochlorothiazide (MICROZIDE) 12.5 MG capsule Take 12.5 mg by mouth daily.       . metoprolol succinate (TOPROL-XL) 50 MG 24 hr tablet Take 50 mg by mouth daily. Take with or immediately following a meal.      . polyethylene glycol powder (GLYCOLAX/MIRALAX) powder TAKE 17 GRAMS BY MOUTH DAILY  527 g  1  . potassium chloride SA (K-DUR,KLOR-CON) 20 MEQ tablet Take 1 tablet (20 mEq total) by mouth daily.  30 tablet  5  . rosuvastatin (CRESTOR) 10 MG tablet Take 10 mg by mouth daily.       No current facility-administered medications on file prior to visit.    BP 153/94  Pulse 73  Temp(Src) 98.8 F (37.1 C) (Oral)  Ht 4\' 10"  (1.473 m)  Wt 152 lb 3.2 oz (69.037 kg)  BMI 31.82 kg/m2  SpO2 98%      Review of Systems  Constitutional: Positive for chills. Negative for fever and fatigue.  HENT: Negative.   Respiratory: Positive for cough. Negative for choking, chest tightness, shortness of breath and wheezing.   Cardiovascular: Negative for chest pain and palpitations.  Gastrointestinal: Positive for abdominal pain, constipation and abdominal  distention. Negative for nausea, vomiting, diarrhea, blood in stool, anal bleeding and rectal pain.       Bloating and mild abdominal discomfort for 2 days. Pain suprapubic and lower quadrants.  Genitourinary: Positive for dysuria, urgency and frequency. Negative for flank pain, decreased urine volume and difficulty urinating.  Musculoskeletal: Positive for back pain.       Reports some but none on the exam.  Skin: Negative.   Neurological: Negative.   Psychiatric/Behavioral: Negative for behavioral problems and agitation.       Objective:   Physical Exam  Constitutional: She is oriented to person, place, and time. She appears well-developed and well-nourished. No distress.  Eyes: Conjunctivae and EOM are normal. Pupils are equal, round, and reactive to light.  Neck: Normal range of motion. Neck supple. No tracheal deviation present. No thyromegaly present.  Cardiovascular: Normal rate, regular rhythm and normal heart  sounds.  Exam reveals no gallop.   No murmur heard. Pulmonary/Chest: Effort normal and breath sounds normal. No respiratory distress. She has no wheezes. She has no rales. She exhibits no tenderness.  Abdominal: Soft. Bowel sounds are normal. She exhibits distension. She exhibits no mass. There is tenderness. There is no rebound and no guarding.  Mild suprapubic tenderness. Faint lower quadrants mild tender. Positive bs. Mild decrease.  Musculoskeletal:  No midspine pain on palpatin. No cva tenderness.  Lymphadenopathy:    She has no cervical adenopathy.  Neurological: She is alert and oriented to person, place, and time.  Skin: Skin is warm and dry.  Psychiatric: She has a normal mood and affect. Her behavior is normal. Judgment and thought content normal.          Assessment & Plan:

## 2014-09-17 NOTE — Patient Instructions (Signed)
For your constipation, I want you to use miralax otc and get xray abdomen now. Give [place] update tomorrow whether or not had BM. If pain worsens or changes the ED evaluation.  We will send your urine out for culture. If pending the culture your symptoms worsen then start cipro.(That was sent to your pharmacy.)  Follow up in 7 days or as needed.

## 2014-09-17 NOTE — Assessment & Plan Note (Signed)
Pt has for 2 days. She states chronic. Mild decrease bm. Will get xrays today and advise pt to use miralax today. Give [place] update if successful BM. If constipation worsens with more bloating or pain consider ct scan.

## 2014-09-18 LAB — URINE CULTURE
COLONY COUNT: NO GROWTH
Organism ID, Bacteria: NO GROWTH

## 2014-09-27 ENCOUNTER — Other Ambulatory Visit: Payer: Self-pay | Admitting: General Practice

## 2014-09-27 MED ORDER — HYDROCHLOROTHIAZIDE 12.5 MG PO CAPS: 12.5000 mg | ORAL_CAPSULE | Freq: Every day | ORAL | Status: DC

## 2014-09-27 MED ORDER — METOPROLOL SUCCINATE ER 50 MG PO TB24
50.0000 mg | ORAL_TABLET | Freq: Every day | ORAL | Status: DC
Start: 2014-09-27 — End: 2014-09-30

## 2014-09-30 ENCOUNTER — Other Ambulatory Visit: Payer: Self-pay | Admitting: General Practice

## 2014-09-30 MED ORDER — HYDROCHLOROTHIAZIDE 12.5 MG PO CAPS: 12.5000 mg | ORAL_CAPSULE | Freq: Every day | ORAL | Status: DC

## 2014-09-30 MED ORDER — METOPROLOL SUCCINATE ER 50 MG PO TB24: 50.0000 mg | ORAL_TABLET | Freq: Every day | ORAL | Status: DC

## 2014-10-14 ENCOUNTER — Other Ambulatory Visit: Payer: Self-pay | Admitting: Family Medicine

## 2014-10-14 DIAGNOSIS — Z1231 Encounter for screening mammogram for malignant neoplasm of breast: Secondary | ICD-10-CM

## 2014-10-16 ENCOUNTER — Ambulatory Visit (HOSPITAL_COMMUNITY)
Admission: RE | Admit: 2014-10-16 | Discharge: 2014-10-16 | Disposition: A | Discharge status: Home / Self Care | Payer: 59 | Source: Ambulatory Visit | Attending: Family Medicine | Admitting: Family Medicine

## 2014-10-16 DIAGNOSIS — Z1231 Encounter for screening mammogram for malignant neoplasm of breast: Secondary | ICD-10-CM | POA: Diagnosis present

## 2014-10-25 ENCOUNTER — Other Ambulatory Visit: Payer: Self-pay | Admitting: General Practice

## 2014-10-25 MED ORDER — ROSUVASTATIN CALCIUM 10 MG PO TABS: 10.0000 mg | ORAL_TABLET | Freq: Every day | ORAL | Status: DC

## 2014-10-30 ENCOUNTER — Encounter: Payer: Self-pay | Admitting: Family Medicine

## 2014-10-30 ENCOUNTER — Ambulatory Visit (INDEPENDENT_AMBULATORY_CARE_PROVIDER_SITE_OTHER): Payer: 59 | Admitting: Family Medicine

## 2014-10-30 VITALS — BP 126/80 | HR 68 | Temp 98.0°F | Resp 16 | Wt 149.5 lb

## 2014-10-30 DIAGNOSIS — R195 Other fecal abnormalities: Secondary | ICD-10-CM

## 2014-10-30 NOTE — Progress Notes (Signed)
Pre visit review using our clinic review tool, if applicable. No additional management support is needed unless otherwise documented below in the visit note. 

## 2014-10-30 NOTE — Progress Notes (Signed)
   Subjective:    Patient ID: Abigail Ellis, female    DOB: [birth year], 58 y.o.   MRN: [id number]  HPI Pt reports she ate steamed carrots, had BM next day and it was orange.  Pt reports seeing 'small, tiny, tip of finger but skinny like hair, orangey creature' in the shower after washing bottom.  No rectal itching.  No N/V, weight loss.  No abd pain.  'i'm worried i have parasites'.  No recent foreign travel or change in diet.  No recent camping.   Review of Systems For ROS see HPI     Objective:   Physical Exam  Constitutional: She is oriented to person, place, and time. She appears well-developed and well-nourished. No distress.  Abdominal: Soft. Bowel sounds are normal. She exhibits no distension. There is no tenderness. There is no rebound and no guarding.  Genitourinary:  Small external hemorrhoids DRE w/o abnormality  Neurological: She is alert and oriented to person, place, and time.  Skin: Skin is warm and dry.  Psychiatric: She has a normal mood and affect. Her behavior is normal.  Vitals reviewed.         Assessment & Plan:

## 2014-10-30 NOTE — Patient Instructions (Signed)
Follow up as needed We'll notify you of your lab results and make any changes if needed Please return stool sample for analysis Continue to monitor stool and try and collect any abnormality that you find Call with any questions or concerns Happy Thanksgiving!

## 2014-10-30 NOTE — Assessment & Plan Note (Signed)
Pt having difficulty describing what she saw in stool but fears 'it was a worm'.  Check CBC to assess for presence of eosinophila and also get stool O&P.  Will continue to monitor.  Pt expressed understanding and is in agreement w/ plan.

## 2014-10-31 LAB — CBC WITH DIFFERENTIAL/PLATELET
BASOS PCT: 0.3 % (ref 0.0–3.0)
Basophils Absolute: 0 10*3/uL (ref 0.0–0.1)
Eosinophils Absolute: 0.1 10*3/uL (ref 0.0–0.7)
Eosinophils Relative: 0.9 % (ref 0.0–5.0)
HCT: 39.5 % (ref 36.0–46.0)
HEMOGLOBIN: 12.1 g/dL (ref 12.0–15.0)
LYMPHS ABS: 1.8 10*3/uL (ref 0.7–4.0)
Lymphocytes Relative: 24.1 % (ref 12.0–46.0)
MCHC: 30.5 g/dL (ref 30.0–36.0)
MONOS PCT: 6.5 % (ref 3.0–12.0)
Monocytes Absolute: 0.5 10*3/uL (ref 0.1–1.0)
NEUTROS ABS: 5 10*3/uL (ref 1.4–7.7)
Neutrophils Relative %: 68.2 % (ref 43.0–77.0)
PLATELETS: 214 10*3/uL (ref 150.0–400.0)
RBC: 5.96 Mil/uL — ABNORMAL HIGH (ref 3.87–5.11)
RDW: 16.6 % — ABNORMAL HIGH (ref 11.5–15.5)
WBC: 7.4 10*3/uL (ref 4.0–10.5)

## 2014-11-01 LAB — OVA AND PARASITE EXAMINATION: OP: NONE SEEN

## 2014-12-03 ENCOUNTER — Ambulatory Visit (INDEPENDENT_AMBULATORY_CARE_PROVIDER_SITE_OTHER): Payer: 59 | Admitting: Physician Assistant

## 2014-12-03 ENCOUNTER — Encounter: Payer: Self-pay | Admitting: Physician Assistant

## 2014-12-03 VITALS — BP 147/85 | HR 64 | Temp 98.2°F | Resp 14 | Ht <= 58 in | Wt 148.4 lb

## 2014-12-03 DIAGNOSIS — H6980 Other specified disorders of Eustachian tube, unspecified ear: Secondary | ICD-10-CM | POA: Insufficient documentation

## 2014-12-03 DIAGNOSIS — B9789 Other viral agents as the cause of diseases classified elsewhere: Secondary | ICD-10-CM

## 2014-12-03 DIAGNOSIS — J028 Acute pharyngitis due to other specified organisms: Principal | ICD-10-CM

## 2014-12-03 DIAGNOSIS — J029 Acute pharyngitis, unspecified: Secondary | ICD-10-CM

## 2014-12-03 DIAGNOSIS — H6981 Other specified disorders of Eustachian tube, right ear: Secondary | ICD-10-CM

## 2014-12-03 DIAGNOSIS — H6990 Unspecified Eustachian tube disorder, unspecified ear: Secondary | ICD-10-CM | POA: Insufficient documentation

## 2014-12-03 LAB — POCT RAPID STREP A (OFFICE): Rapid Strep A Screen: NEGATIVE

## 2014-12-03 MED ORDER — POTASSIUM CHLORIDE CRYS ER 20 MEQ PO TBCR: 20.0000 meq | EXTENDED_RELEASE_TABLET | Freq: Every day | ORAL | Status: DC

## 2014-12-03 MED ORDER — FLUTICASONE PROPIONATE 50 MCG/ACT NA SUSP: 2.0000 | Freq: Every day | NASAL | Status: DC

## 2014-12-03 NOTE — Assessment & Plan Note (Signed)
Exam within normal limits.  Rapid strep negative.  Will send for cutltures.  OTC pain medication and supportive measures discussed.  Will initiate ABX if indicated by Culture.

## 2014-12-03 NOTE — Progress Notes (Signed)
Pre visit review using our clinic review tool, if applicable. No additional management support is needed unless otherwise documented below in the visit note/SLS  

## 2014-12-03 NOTE — Progress Notes (Signed)
HPI:  Abigail Ellis is a 58 y.o. female presenting with a sore throat for 2 days.  Associated symptoms include:  ear pain and ear fullness.  Symptoms are constant.  Home treatment thus far includes:  rest, hydration and NSAIDS/acetaminophen.  No known sick contacts with similar symptoms.  There is no history of of similar symptoms.  ROS: Pertinent ROS are listed in HPI.  Objective: Exam:  BP 147/85 mmHg  Pulse 64  Temp(Src) 98.2 F (36.8 C) (Oral)  Resp 14  Ht 4\' 10"  (1.473 m)  Wt 148 lb 6 oz (67.302 kg)  BMI 31.02 kg/m2  SpO2 100% BP 147/85 mmHg  Pulse 64  Temp(Src) 98.2 F (36.8 C) (Oral)  Resp 14  Ht 4\' 10"  (1.473 m)  Wt 148 lb 6 oz (67.302 kg)  BMI 31.02 kg/m2  SpO2 100%  General Appearance:    Alert, cooperative, no distress, appears stated age  Head:    Normocephalic, without obvious abnormality, atraumatic  Eyes:    PERRL, conjunctiva/corneas clear, EOM's intact, fundi    benign, both eyes  Ears:    Normal TM's and external ear canals, both ears  Nose:   Nares normal, septum midline, mucosa normal, no drainage    or sinus tenderness  Throat:   Lips, mucosa, and tongue normal; teeth and gums normal  Neck:   Supple, symmetrical, trachea midline, no adenopathy;    thyroid:  no enlargement/tenderness/nodules; no carotid   bruit or JVD  Lymph nodes:   Cervical, supraclavicular, and axillary nodes normal  Neurologic:   CNII-XII intact, normal strength, sensation and reflexes    throughout   Labs: Rapid Strep -- Negative.  Assessment/Plan: Sore throat (viral) Exam within normal limits.  Rapid strep negative.  Will send for cutltures.  OTC pain medication and supportive measures discussed.  Will initiate ABX if indicated by Culture.  Eustachian tube dysfunction Rx Flonase.  Daily Claritin.  Return precautions discussed with patient.

## 2014-12-03 NOTE — Assessment & Plan Note (Signed)
Rx Flonase.  Daily Claritin.  Return precautions discussed with patient.

## 2014-12-03 NOTE — Patient Instructions (Signed)
Please stay well hydrated.  Use Flonase as directed to remove fluid behind ear.  Take a daily claritin.  Use tylenol for throat pain.  Salt water gargles may also help.  I will call you with your throat culture results.  Pharyngitis Pharyngitis is redness, pain, and swelling (inflammation) of your pharynx.  CAUSES  Pharyngitis is usually caused by infection. Most of the time, these infections are from viruses (viral) and are part of a cold. However, sometimes pharyngitis is caused by bacteria (bacterial). Pharyngitis can also be caused by allergies. Viral pharyngitis may be spread from person to person by coughing, sneezing, and personal items or utensils (cups, forks, spoons, toothbrushes). Bacterial pharyngitis may be spread from person to person by more intimate contact, such as kissing.  SIGNS AND SYMPTOMS  Symptoms of pharyngitis include:   Sore throat.   Tiredness (fatigue).   Low-grade fever.   Headache.  Joint pain and muscle aches.  Skin rashes.  Swollen lymph nodes.  Plaque-like film on throat or tonsils (often seen with bacterial pharyngitis). DIAGNOSIS  Your health care provider will ask you questions about your illness and your symptoms. Your medical history, along with a physical exam, is often all that is needed to diagnose pharyngitis. Sometimes, a rapid strep test is done. Other lab tests may also be done, depending on the suspected cause.  TREATMENT  Viral pharyngitis will usually get better in 3-4 days without the use of medicine. Bacterial pharyngitis is treated with medicines that kill germs (antibiotics).  HOME CARE INSTRUCTIONS   Drink enough water and fluids to keep your urine clear or pale yellow.   Only take over-the-counter or prescription medicines as directed by your health care provider:   If you are prescribed antibiotics, make sure you finish them even if you start to feel better.   Do not take aspirin.   Get lots of rest.   Gargle with  8 oz of salt water ( tsp of salt per 1 qt of water) as often as every 1-2 hours to soothe your throat.   Throat lozenges (if you are not at risk for choking) or sprays may be used to soothe your throat. SEEK MEDICAL CARE IF:   You have large, tender lumps in your neck.  You have a rash.  You cough up green, yellow-brown, or bloody spit. SEEK IMMEDIATE MEDICAL CARE IF:   Your neck becomes stiff.  You drool or are unable to swallow liquids.  You vomit or are unable to keep medicines or liquids down.  You have severe pain that does not go away with the use of recommended medicines.  You have trouble breathing (not caused by a stuffy nose). MAKE SURE YOU:   Understand these instructions.  Will watch your condition.  Will get help right away if you are not doing well or get worse. Document Released: 11/29/2005 Document Revised: 09/19/2013 Document Reviewed: 08/06/2013 [facility] Patient Information 2015 [place], [place]. This information is not intended to replace advice given to you by your health care provider. Make sure you discuss any questions you have with your health care provider.

## 2014-12-03 NOTE — Addendum Note (Signed)
Addended by: Rockwell Germany on: 12/03/2014 03:00 PM   Modules accepted: Orders

## 2014-12-05 LAB — CULTURE, GROUP A STREP: Organism ID, Bacteria: NORMAL

## 2014-12-08 ENCOUNTER — Emergency Department (HOSPITAL_COMMUNITY)
Admission: EM | Admit: 2014-12-08 | Discharge: 2014-12-08 | Disposition: A | Discharge status: Home / Self Care | Payer: 59 | Source: Home / Self Care | Attending: Family Medicine | Admitting: Family Medicine

## 2014-12-08 ENCOUNTER — Encounter (HOSPITAL_COMMUNITY): Payer: Self-pay | Admitting: Emergency Medicine

## 2014-12-08 DIAGNOSIS — B9789 Other viral agents as the cause of diseases classified elsewhere: Secondary | ICD-10-CM

## 2014-12-08 DIAGNOSIS — H6981 Other specified disorders of Eustachian tube, right ear: Secondary | ICD-10-CM

## 2014-12-08 DIAGNOSIS — J029 Acute pharyngitis, unspecified: Secondary | ICD-10-CM

## 2014-12-08 DIAGNOSIS — J028 Acute pharyngitis due to other specified organisms: Principal | ICD-10-CM

## 2014-12-08 LAB — POCT RAPID STREP A: STREPTOCOCCUS, GROUP A SCREEN (DIRECT): NEGATIVE

## 2014-12-08 MED ORDER — PREDNISONE 10 MG PO TABS: 30.0000 mg | ORAL_TABLET | Freq: Every day | ORAL | Status: DC

## 2014-12-08 MED ORDER — IPRATROPIUM BROMIDE 0.06 % NA SOLN: 2.0000 | Freq: Four times a day (QID) | NASAL | Status: DC

## 2014-12-08 NOTE — ED Provider Notes (Signed)
Abigail Ellis is a 58 y.o. female who presents to Urgent Care today for sore throat and ear pain. Patient is been complaining of right-sided sore throat and right ear pain since December 21. She was seen by her primary care provider on that day where a strep test was negative. She was given Flonase nasal spray. She has not improved much. No fevers or chills vomiting or diarrhea. No trouble breathing or swallowing. She's tried some over-the-counter medications which have helped.   Past Medical History  Diagnosis Date  . Hypertension   . Anemia   . Hyperlipidemia   . Migraines    History reviewed. No pertinent past surgical history. History  Substance Use Topics  . Smoking status: Former Smoker -- 0.25 packs/day for 10 years    Types: Cigarettes    Start date: 11/29/2014  . Smokeless tobacco: Not on file  . Alcohol Use: No   ROS as above Medications: No current facility-administered medications for this encounter.   Current Outpatient Prescriptions  Medication Sig Dispense Refill  . amLODipine (NORVASC) 10 MG tablet Take 10 mg by mouth daily.    Marland Kitchen aspirin 325 MG tablet Take 325 mg by mouth daily.    Marland Kitchen BIOTIN PO Take by mouth daily.      . Cholecalciferol (VITAMIN D-3 PO) Take 1 tablet by mouth daily.    . fluticasone (FLONASE) 50 MCG/ACT nasal spray Place 2 sprays into both nostrils daily. 16 g 6  . hydrochlorothiazide (MICROZIDE) 12.5 MG capsule Take 1 capsule (12.5 mg total) by mouth daily. 30 capsule 6  . ipratropium (ATROVENT) 0.06 % nasal spray Place 2 sprays into both nostrils 4 (four) times daily. 15 mL 1  . metoprolol succinate (TOPROL-XL) 50 MG 24 hr tablet Take 1 tablet (50 mg total) by mouth daily. Take with or immediately following a meal. 30 tablet 6  . potassium chloride SA (K-DUR,KLOR-CON) 20 MEQ tablet Take 1 tablet (20 mEq total) by mouth daily. 30 tablet 1  . predniSONE (DELTASONE) 10 MG tablet Take 3 tablets (30 mg total) by mouth daily. 15 tablet 0  . rosuvastatin  (CRESTOR) 10 MG tablet Take 1 tablet (10 mg total) by mouth daily. 30 tablet 3   Allergies  Allergen Reactions  . Zocor [Simvastatin] Other (See Comments)    Severe muscle pain and joint pain      Exam:  BP 147/91 mmHg  Pulse 63  Temp(Src) 98.5 F (36.9 C) (Oral)  Resp 18  SpO2 100% Gen: Well NAD HEENT: EOMI,  MMM right to take membranes retracted without effusion or erythema left is normal. Posterior pharynx with cobblestoning. Lungs: Normal work of breathing. CTABL Heart: RRR no MRG Abd: NABS, Soft. Nondistended, Nontender Exts: Brisk capillary refill, warm and well perfused.   Results for orders placed or performed during the hospital encounter of 12/08/14 (from the past 24 hour(s))  POCT rapid strep [facility])     Status: None   Collection Time: 12/08/14  1:07 PM  Result Value Ref Range   Streptococcus, Group A Screen (Direct) NEGATIVE NEGATIVE   No results found.  Assessment and Plan: 58 y.o. female with viral pharyngitis and barotitis media. Treatment with prednisone and Atrovent nasal spray. Her pending.  Discussed warning signs or symptoms. Please see discharge instructions. Patient expresses understanding.     Gregor Hams, MD 12/08/14 [phone]

## 2014-12-08 NOTE — ED Notes (Signed)
Pt states that she has had a sore throat since 12/02/2014 and c/o ear pain.

## 2014-12-08 NOTE — Discharge Instructions (Signed)
Thank you for coming in today. Call or go to the emergency room if you get worse, have trouble breathing, have chest pains, or palpitations.   Pharyngitis Pharyngitis is redness, pain, and swelling (inflammation) of your pharynx.  CAUSES  Pharyngitis is usually caused by infection. Most of the time, these infections are from viruses (viral) and are part of a cold. However, sometimes pharyngitis is caused by bacteria (bacterial). Pharyngitis can also be caused by allergies. Viral pharyngitis may be spread from person to person by coughing, sneezing, and personal items or utensils (cups, forks, spoons, toothbrushes). Bacterial pharyngitis may be spread from person to person by more intimate contact, such as kissing.  SIGNS AND SYMPTOMS  Symptoms of pharyngitis include:   Sore throat.   Tiredness (fatigue).   Low-grade fever.   Headache.  Joint pain and muscle aches.  Skin rashes.  Swollen lymph nodes.  Plaque-like film on throat or tonsils (often seen with bacterial pharyngitis). DIAGNOSIS  Your health care provider will ask you questions about your illness and your symptoms. Your medical history, along with a physical exam, is often all that is needed to diagnose pharyngitis. Sometimes, a rapid strep test is done. Other lab tests may also be done, depending on the suspected cause.  TREATMENT  Viral pharyngitis will usually get better in 3-4 days without the use of medicine. Bacterial pharyngitis is treated with medicines that kill germs (antibiotics).  HOME CARE INSTRUCTIONS   Drink enough water and fluids to keep your urine clear or pale yellow.   Only take over-the-counter or prescription medicines as directed by your health care provider:   If you are prescribed antibiotics, make sure you finish them even if you start to feel better.   Do not take aspirin.   Get lots of rest.   Gargle with 8 oz of salt water ( tsp of salt per 1 qt of water) as often as every 1-2  hours to soothe your throat.   Throat lozenges (if you are not at risk for choking) or sprays may be used to soothe your throat. SEEK MEDICAL CARE IF:   You have large, tender lumps in your neck.  You have a rash.  You cough up green, yellow-brown, or bloody spit. SEEK IMMEDIATE MEDICAL CARE IF:   Your neck becomes stiff.  You drool or are unable to swallow liquids.  You vomit or are unable to keep medicines or liquids down.  You have severe pain that does not go away with the use of recommended medicines.  You have trouble breathing (not caused by a stuffy nose). MAKE SURE YOU:   Understand these instructions.  Will watch your condition.  Will get help right away if you are not doing well or get worse. Document Released: 11/29/2005 Document Revised: 09/19/2013 Document Reviewed: 08/06/2013 [facility] Patient Information 2015 [place], [place]. This information is not intended to replace advice given to you by your health care provider. Make sure you discuss any questions you have with your health care provider.   Barotitis Media Barotitis media is inflammation of your middle ear. This occurs when the auditory tube (eustachian tube) leading from the back of your nose (nasopharynx) to your eardrum is blocked. This blockage may result from a cold, environmental allergies, or an upper respiratory infection. Unresolved barotitis media may lead to damage or hearing loss (barotrauma), which may become permanent. HOME CARE INSTRUCTIONS   Use medicines as recommended by your health care provider. Over-the-counter medicines will help unblock the canal  and can help during times of air travel.  Do not put anything into your ears to clean or unplug them. Eardrops will not be helpful.  Do not swim, dive, or fly until your health care provider says it is all right to do so. If these activities are necessary, chewing gum with frequent, forceful swallowing may help. It is also helpful to hold  your nose and gently blow to pop your ears for equalizing pressure changes. This forces air into the eustachian tube.  Only take over-the-counter or prescription medicines for pain, discomfort, or fever as directed by your health care provider.  A decongestant may be helpful in decongesting the middle ear and make pressure equalization easier. SEEK MEDICAL CARE IF:  You experience a serious form of dizziness in which you feel as if the room is spinning and you feel nauseated (vertigo).  Your symptoms only involve one ear. SEEK IMMEDIATE MEDICAL CARE IF:   You develop a severe headache, dizziness, or severe ear pain.  You have bloody or pus-like drainage from your ears.  You develop a fever.  Your problems do not improve or become worse. MAKE SURE YOU:   Understand these instructions.  Will watch your condition.  Will get help right away if you are not doing well or get worse. Document Released: 11/26/2000 Document Revised: 09/19/2013 Document Reviewed: 06/26/2013 [facility] Patient Information 2015 [place], [place]. This information is not intended to replace advice given to you by your health care provider. Make sure you discuss any questions you have with your health care provider.

## 2014-12-10 LAB — CULTURE, GROUP A STREP

## 2015-01-21 ENCOUNTER — Encounter: Payer: Self-pay | Admitting: Family Medicine

## 2015-01-21 ENCOUNTER — Ambulatory Visit (INDEPENDENT_AMBULATORY_CARE_PROVIDER_SITE_OTHER): Payer: 59 | Admitting: Family Medicine

## 2015-01-21 VITALS — BP 120/80 | HR 67 | Temp 98.0°F | Resp 16 | Ht 59.0 in | Wt 148.1 lb

## 2015-01-21 DIAGNOSIS — Z8249 Family history of ischemic heart disease and other diseases of the circulatory system: Secondary | ICD-10-CM | POA: Insufficient documentation

## 2015-01-21 DIAGNOSIS — Z01419 Encounter for gynecological examination (general) (routine) without abnormal findings: Secondary | ICD-10-CM

## 2015-01-21 LAB — BASIC METABOLIC PANEL
BUN: 15 mg/dL (ref 6–23)
CO2: 31 mEq/L (ref 19–32)
Calcium: 9.9 mg/dL (ref 8.4–10.5)
Chloride: 101 mEq/L (ref 96–112)
Creatinine, Ser: 0.72 mg/dL (ref 0.40–1.20)
GFR: 88.02 mL/min (ref >60.00)
GLUCOSE: 107 mg/dL — AB (ref 70–99)
POTASSIUM: 4 meq/L (ref 3.5–5.1)
Sodium: 139 mEq/L (ref 135–145)

## 2015-01-21 LAB — CBC WITH DIFFERENTIAL/PLATELET
Basophils Absolute: 0 10*3/uL (ref 0.0–0.1)
Basophils Relative: 0.3 % (ref 0.0–3.0)
Eosinophils Absolute: 0.1 10*3/uL (ref 0.0–0.7)
Eosinophils Relative: 1.3 % (ref 0.0–5.0)
HCT: 37.7 % (ref 36.0–46.0)
Hemoglobin: 12 g/dL (ref 12.0–15.0)
LYMPHS ABS: 1.3 10*3/uL (ref 0.7–4.0)
LYMPHS PCT: 23.3 % (ref 12.0–46.0)
MCHC: 31.8 g/dL (ref 30.0–36.0)
MCV: 64 fl — ABNORMAL LOW (ref 78.0–100.0)
MONO ABS: 0.4 10*3/uL (ref 0.1–1.0)
MONOS PCT: 6.9 % (ref 3.0–12.0)
Neutro Abs: 3.9 10*3/uL (ref 1.4–7.7)
Neutrophils Relative %: 68.2 % (ref 43.0–77.0)
PLATELETS: 241 10*3/uL (ref 150.0–400.0)
RBC: 5.89 Mil/uL — ABNORMAL HIGH (ref 3.87–5.11)
RDW: 16.3 % — AB (ref 11.5–15.5)
WBC: 5.8 10*3/uL (ref 4.0–10.5)

## 2015-01-21 LAB — HEPATIC FUNCTION PANEL
ALT: 23 U/L (ref 0–35)
AST: 21 U/L (ref 0–37)
Albumin: 4.4 g/dL (ref 3.5–5.2)
Alkaline Phosphatase: 72 U/L (ref 39–117)
BILIRUBIN TOTAL: 0.4 mg/dL (ref 0.2–1.2)
Bilirubin, Direct: 0.1 mg/dL (ref 0.0–0.3)
Total Protein: 7.3 g/dL (ref 6.0–8.3)

## 2015-01-21 LAB — TSH: TSH: 0.88 u[IU]/mL (ref 0.35–4.50)

## 2015-01-21 LAB — VITAMIN D 25 HYDROXY (VIT D DEFICIENCY, FRACTURES): VITD: 51.11 ng/mL (ref 30.00–100.00)

## 2015-01-21 LAB — LIPID PANEL
Cholesterol: 199 mg/dL (ref 0–200)
HDL: 64.4 mg/dL (ref >39.00)
NONHDL: 134.6
Total CHOL/HDL Ratio: 3
Triglycerides: 231 mg/dL — ABNORMAL HIGH (ref 0.0–149.0)
VLDL: 46.2 mg/dL — ABNORMAL HIGH (ref 0.0–40.0)

## 2015-01-21 LAB — LDL CHOLESTEROL, DIRECT: Direct LDL: 96 mg/dL

## 2015-01-21 NOTE — Progress Notes (Signed)
   Subjective:    Patient ID: Abigail Ellis, female    DOB: 1956/07/02, 59 y.o.   MRN: [id number]  HPI CPE- UTD on pap (2014), mammo, colonoscopy.  No concerns today.   Review of Systems Patient reports no vision/ hearing changes, adenopathy,fever, weight change,  persistant/recurrent hoarseness , swallowing issues, chest pain, palpitations, edema, persistant/recurrent cough, hemoptysis, dyspnea (rest/exertional/paroxysmal nocturnal), gastrointestinal bleeding (melena, rectal bleeding), abdominal pain, significant heartburn, bowel changes, GU symptoms (dysuria, hematuria, incontinence), Gyn symptoms (abnormal  bleeding, pain),  syncope, focal weakness, memory loss, numbness & tingling, skin/hair/nail changes, abnormal bruising or bleeding, anxiety, or depression.     Objective:   Physical Exam  General Appearance:    Alert, cooperative, no distress, appears stated age  Head:    Normocephalic, without obvious abnormality, atraumatic  Eyes:    PERRL, conjunctiva/corneas clear, EOM's intact, fundi    benign, both eyes  Ears:    Normal TM's and external ear canals, both ears  Nose:   Nares normal, septum midline, mucosa normal, no drainage    or sinus tenderness  Throat:   Lips, mucosa, and tongue normal; teeth and gums normal  Neck:   Supple, symmetrical, trachea midline, no adenopathy;    Thyroid: no enlargement/tenderness/nodules  Back:     Symmetric, no curvature, ROM normal, no CVA tenderness  Lungs:     Clear to auscultation bilaterally, respirations unlabored  Chest Wall:    No tenderness or deformity   Heart:    Regular rate and rhythm, S1 and S2 normal, no murmur, rub   or gallop  Breast Exam:    Deferred to mammo  Abdomen:     Soft, non-tender, bowel sounds active all four quadrants,    no masses, no organomegaly  Genitalia:    External genitalia normal, mucosa pink and moist, no lesions or discharge present  Rectal:    Normal external appearance  Extremities:   Extremities  normal, atraumatic, no cyanosis or edema  Pulses:   2+ and symmetric all extremities  Skin:   Skin color, texture, turgor normal, no rashes or lesions  Lymph nodes:   Cervical, supraclavicular, and axillary nodes normal  Neurologic:   CNII-XII intact, normal strength, sensation and reflexes    throughout          Assessment & Plan:

## 2015-01-21 NOTE — Progress Notes (Signed)
Pre visit review using our clinic review tool, if applicable. No additional management support is needed unless otherwise documented below in the visit note. 

## 2015-01-21 NOTE — Assessment & Plan Note (Signed)
Pt's parents and now brother w/ MI/CAD.  Pt is concerned about her risk factors- HTN, hyperlipidemia, pre-diabetes.  Will refer to cards for evaluation.  Pt expressed understanding and is in agreement w/ plan.

## 2015-01-21 NOTE — Patient Instructions (Signed)
Follow up in 6 months to recheck BP and cholesterol We'll notify you of your lab results and make any changes if needed We'll call you with your cardiology appt Keep up the good work on healthy diet and regular Use the Miralax daily Use [place] or [place] for vaginal dryness Call with any questions or concerns Happy New Year!

## 2015-01-21 NOTE — Assessment & Plan Note (Signed)
Pt's PE WNL.  UTD on colonoscopy, mammo, pap.  Check labs.  Anticipatory guidance provided.

## 2015-01-22 ENCOUNTER — Other Ambulatory Visit: Payer: Self-pay | Admitting: General Practice

## 2015-01-22 MED ORDER — FENOFIBRATE 160 MG PO TABS: 160.0000 mg | ORAL_TABLET | Freq: Every day | ORAL | Status: DC

## 2015-02-03 ENCOUNTER — Telehealth: Payer: Self-pay | Admitting: Family Medicine

## 2015-02-03 ENCOUNTER — Other Ambulatory Visit: Payer: Self-pay | Admitting: General Practice

## 2015-02-03 MED ORDER — AMLODIPINE BESYLATE 10 MG PO TABS: 10.0000 mg | ORAL_TABLET | Freq: Every day | ORAL | Status: DC

## 2015-02-03 NOTE — Telephone Encounter (Signed)
Caller name:Taniyah Ardelle Lesches Relationship to patient:Self Can be reached:cbr cell [facility]acy:[facility] [address]  Reason for call: PT requesting RX refill of amLODipine (NORVASC) 10 MG tablet -

## 2015-02-03 NOTE — Telephone Encounter (Signed)
This was filled today

## 2015-02-27 IMAGING — CR DG CHEST 2V
2 series · 2 of 2 positions shown · non-contrast
Comparison: None.

CLINICAL DATA: Loss of consciousness

EXAM:
CHEST  2 VIEW

[w chest pa]
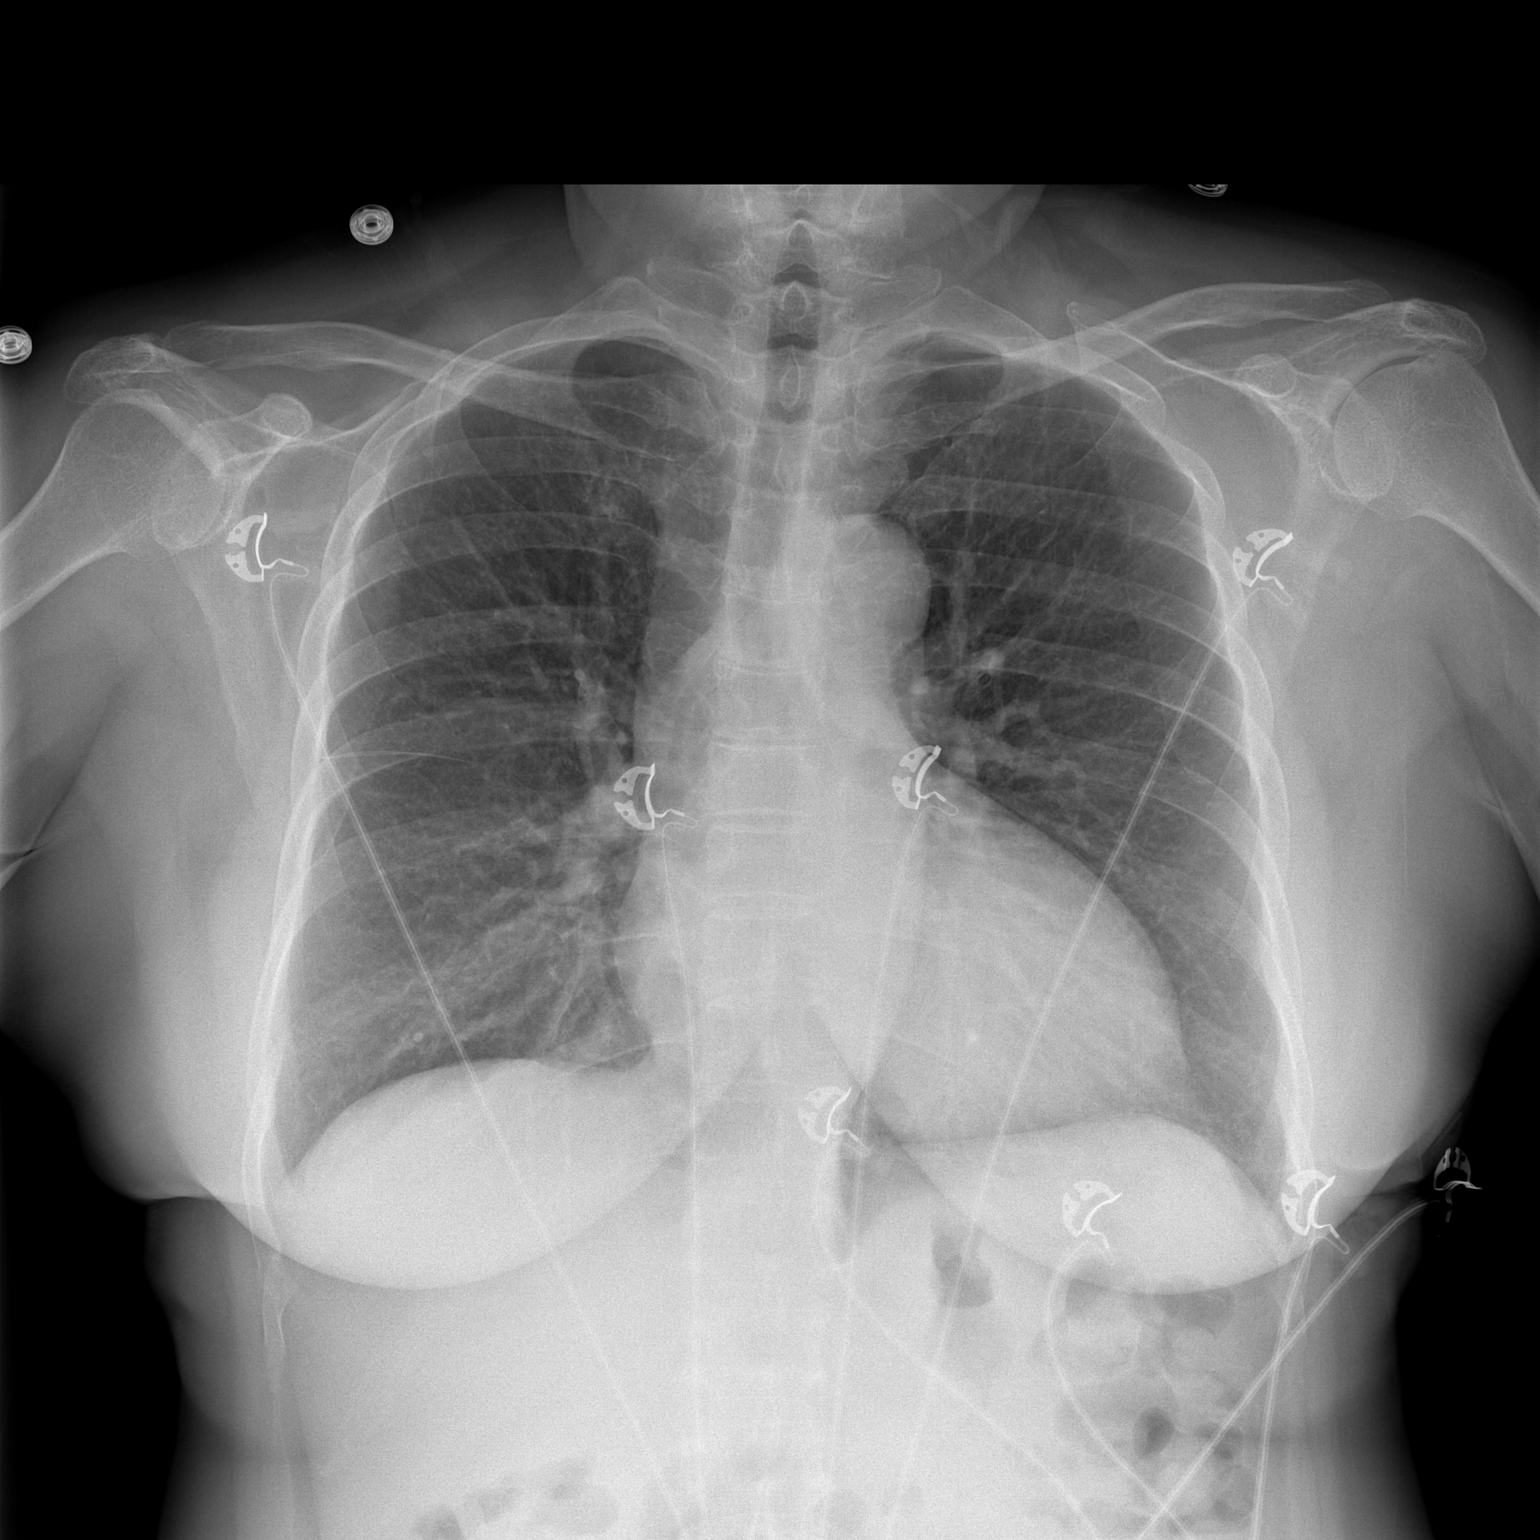

[w chest lat]
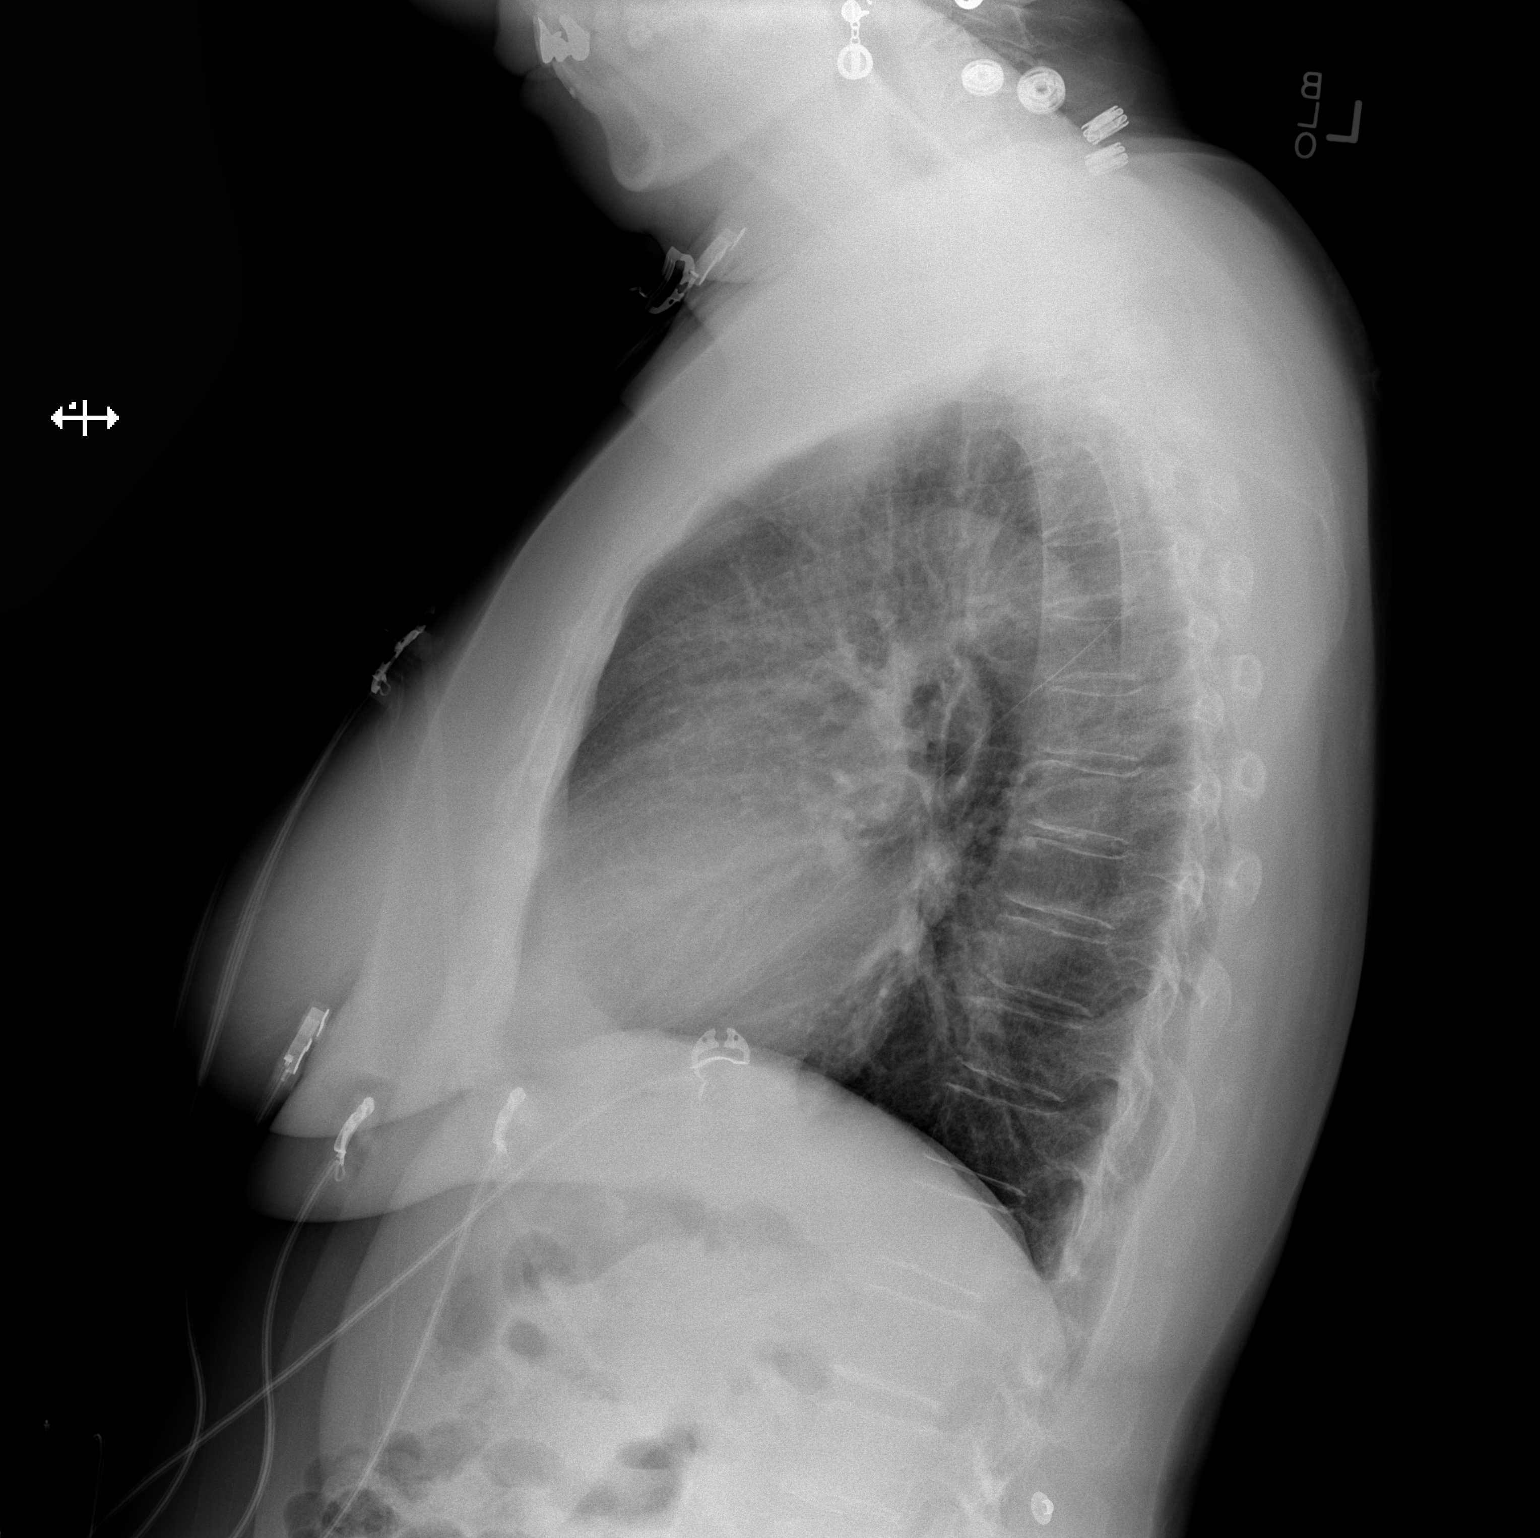

[2 of 2 positions shown; findings below may reference images not displayed]

FINDINGS: Cardiomediastinal silhouette is unremarkable. No acute infiltrate or
pleural effusion. No pulmonary edema. Bony thorax is unremarkable.
IMPRESSION: No active cardiopulmonary disease.

## 2015-03-02 ENCOUNTER — Other Ambulatory Visit: Payer: Self-pay | Admitting: Family Medicine

## 2015-03-03 NOTE — Telephone Encounter (Signed)
Med filled.  

## 2015-03-19 ENCOUNTER — Encounter: Payer: Self-pay | Admitting: Cardiology

## 2015-03-19 ENCOUNTER — Encounter: Payer: Self-pay | Admitting: Family Medicine

## 2015-03-19 ENCOUNTER — Ambulatory Visit (INDEPENDENT_AMBULATORY_CARE_PROVIDER_SITE_OTHER): Payer: 59 | Admitting: Family Medicine

## 2015-03-19 ENCOUNTER — Ambulatory Visit (INDEPENDENT_AMBULATORY_CARE_PROVIDER_SITE_OTHER): Payer: 59 | Admitting: Cardiology

## 2015-03-19 VITALS — BP 124/72 | HR 62 | Ht 59.0 in | Wt 152.0 lb

## 2015-03-19 VITALS — BP 120/80 | HR 59 | Temp 98.2°F | Resp 16 | Wt 153.0 lb

## 2015-03-19 DIAGNOSIS — I10 Essential (primary) hypertension: Secondary | ICD-10-CM | POA: Diagnosis not present

## 2015-03-19 DIAGNOSIS — N952 Postmenopausal atrophic vaginitis: Secondary | ICD-10-CM | POA: Insufficient documentation

## 2015-03-19 DIAGNOSIS — R0600 Dyspnea, unspecified: Secondary | ICD-10-CM | POA: Diagnosis not present

## 2015-03-19 DIAGNOSIS — K649 Unspecified hemorrhoids: Secondary | ICD-10-CM

## 2015-03-19 MED ORDER — DIBUCAINE 1 % EX OINT: TOPICAL_OINTMENT | Freq: Three times a day (TID) | CUTANEOUS | Status: DC | PRN

## 2015-03-19 MED ORDER — ESTROGENS, CONJUGATED 0.625 MG/GM VA CREA: TOPICAL_CREAM | VAGINAL | Status: DC

## 2015-03-19 MED ORDER — HYDROCORTISONE ACE-PRAMOXINE 2.5-1 % RE CREA: 1.0000 "application " | TOPICAL_CREAM | Freq: Three times a day (TID) | RECTAL | Status: DC

## 2015-03-19 NOTE — Patient Instructions (Signed)
Follow up as needed Start the Premarin cream externally 2-3x/week- if no improvement in itching, call me so we can send you to GYN Use the Analpram to treat the hemorrhoids (will also help w/ itching) The Nupercainal will help w/ the hemorrhoid pain Call with any questions or concerns Hang in there!!!

## 2015-03-19 NOTE — Assessment & Plan Note (Signed)
New.  Suspect that this is the cause of her vaginal itching.  No evidence of yeast or rash.  No vaginal d/c present.  Will start topical, external estrogen cream.  If no improvement, pt to notify me so we can refer to GYN.  Pt expressed understanding and is in agreement w/ plan.

## 2015-03-19 NOTE — Progress Notes (Signed)
Pre visit review using our clinic review tool, if applicable. No additional management support is needed unless otherwise documented below in the visit note. 

## 2015-03-19 NOTE — Progress Notes (Signed)
   Subjective:    Patient ID: Abigail Ellis, female    DOB: December 13, 1956, 59 y.o.   MRN: [id number]  HPI Vaginal itching- no d/c.  Denies internal itching.  Pt having external burning w/ washing.  No change in soap or detergent.  Pt is spraying perfume in underwear.  Pt has been having the itching since January.  + hemorrhoids w/ itching and pain.   Review of Systems For ROS see HPI     Objective:   Physical Exam  Constitutional: She is oriented to person, place, and time. She appears well-developed and well-nourished. No distress.  HENT:  Head: Normocephalic and atraumatic.  Genitourinary: No vaginal discharge found.  No erythema or rash of vulva or perineum but + vaginal atrophy + external hemorrhoids w/ residual skin tags  Neurological: She is alert and oriented to person, place, and time.  Skin: Skin is warm and dry. No rash noted. No erythema.  Psychiatric: She has a normal mood and affect. Her behavior is normal. Thought content normal.  Vitals reviewed.         Assessment & Plan:

## 2015-03-19 NOTE — Assessment & Plan Note (Signed)
Patient describes some dyspnea with more extreme activities. She is concerned about her family history. Plan exercise treadmill for risk stratification.

## 2015-03-19 NOTE — Progress Notes (Signed)
     HPI: 59 yo female for evaluation of hypertension and family history of CAD. Patient has dyspnea with more extreme activities but not routine activities. No orthopnea or PND. Occasional minimal pedal edema. No exertional chest pain, palpitations or syncope. No claudication.  Current Outpatient Prescriptions  Medication Sig Dispense Refill  . amLODipine (NORVASC) 10 MG tablet Take 1 tablet (10 mg total) by mouth daily. 30 tablet 6  . aspirin 81 MG tablet Take 81 mg by mouth daily.    Marland Kitchen BIOTIN PO Take by mouth daily.      . CRESTOR 10 MG tablet TAKE 1 TABLET BY MOUTH ONCE DAILY 30 tablet 3  . fluticasone (FLONASE) 50 MCG/ACT nasal spray Place 2 sprays into both nostrils daily. 16 g 6  . hydrochlorothiazide (MICROZIDE) 12.5 MG capsule Take 1 capsule (12.5 mg total) by mouth daily. 30 capsule 6  . metoprolol succinate (TOPROL-XL) 50 MG 24 hr tablet Take 1 tablet (50 mg total) by mouth daily. Take with or immediately following a meal. 30 tablet 6   No current facility-administered medications for this visit.    Allergies  Allergen Reactions  . Zocor [Simvastatin] Other (See Comments)    Severe muscle pain and joint pain   . Fenofibrate     "stomach pains"   . Potassium-Containing Compounds     "constipation"     Past Medical History  Diagnosis Date  . Hypertension   . Anemia   . Hyperlipidemia   . Migraines     Past Surgical History  Procedure Laterality Date  . No previous surgery      History   Social History  . Marital Status: Married    Spouse Name: N/A  . Number of Children: 3  . Years of Education: N/A   Occupational History  .      RN   Social History Main Topics  . Smoking status: Former Smoker -- 0.25 packs/day for 10 years    Types: Cigarettes    Start date: 11/29/2014  . Smokeless tobacco: Not on file  . Alcohol Use: No  . Drug Use: No  . Sexual Activity: Not Currently   Other Topics Concern  . Not on file   Social History Narrative     Family History  Problem Relation Age of Onset  . Hypertension Mother     Mitral valve prolapse  . Heart disease Mother   . Hypertension Father   . Cancer Maternal Uncle     colon cancer  . Cancer Maternal Aunt     bladder  . Heart disease Brother     CAD; PCI at 35    ROS: no fevers or chills, productive cough, hemoptysis, dysphasia, odynophagia, melena, hematochezia, dysuria, hematuria, rash, seizure activity, orthopnea, PND, pedal edema, claudication. Remaining systems are negative.  Physical Exam:   Blood pressure 124/72, pulse 62, height 4\' 11"  (1.499 m), weight 152 lb (68.947 kg).  General:  Well developed/well nourished in NAD Skin warm/dry Patient not depressed No peripheral clubbing Back-normal HEENT-normal/normal eyelids Neck supple/normal carotid upstroke bilaterally; no bruits; no JVD; no thyromegaly chest - CTA/ normal expansion CV - RRR/normal S1 and S2; no murmurs, rubs or gallops;  PMI nondisplaced Abdomen -NT/ND, no HSM, no mass, + bowel sounds, no bruit 2+ femoral pulses, no bruits Ext-no edema, chords, 2+ DP Neuro-grossly nonfocal  ECG sinus rhythm at a rate of 62. No ST changes.

## 2015-03-19 NOTE — Assessment & Plan Note (Signed)
Continue statin. 

## 2015-03-19 NOTE — Patient Instructions (Signed)
Your physician recommends that you schedule a follow-up appointment in: AS NEEDED PENDING TEST RESULTS  Your physician has requested that you have an exercise tolerance test. For further information please visit [email or web address]. Please also follow instruction sheet, as given.     Exercise Stress Electrocardiogram An exercise stress electrocardiogram is a test to check how blood flows to your heart. It is done to find areas of poor blood flow. You will need to walk on a treadmill for this test. The electrocardiogram will record your heartbeat when you are at rest and when you are exercising. BEFORE THE PROCEDURE  Do not have drinks with caffeine or foods with caffeine for 24 hours before the test, or as told by your doctor. This includes coffee, tea (even decaf tea), sodas, chocolate, and cocoa.  Follow your doctor's instructions about eating and drinking before the test.  Ask your doctor what medicines you should or should not take before the test. Take your medicines with water unless told by your doctor not to.  If you use an inhaler, bring it with you to the test.  Bring a snack to eat after the test.  Do not  smoke for 4 hours before the test.  Do not put lotions, powders, creams, or oils on your chest before the test.  Wear comfortable shoes and clothing. PROCEDURE  You will have patches put on your chest. Small areas of your chest may need to be shaved. Wires will be connected to the patches.  Your heart rate will be watched while you are resting and while you are exercising.  You will walk on the treadmill. The treadmill will slowly get faster to raise your heart rate.  The test will take about 1-2 hours. AFTER THE PROCEDURE  Your heart rate and blood pressure will be watched after the test.  You may return to your normal diet, activities, and medicines or as told by your doctor. Document Released: 05/17/2008 Document Revised: 04/15/2014 Document Reviewed:  08/06/2013 [employer] Patient Information 2015 [employer]. This information is not intended to replace advice given to you by your health care provider. Make sure you discuss any questions you have with your health care provider.   

## 2015-03-19 NOTE — Assessment & Plan Note (Signed)
Recurring problem for pt.  Has seen GI previously and they mentioned surgery.  Pt doesn't want to pursue this road.  Will start w/ analpram and nupercainal to reduce hemorrhoids and improve sxs.  Reviewed supportive care and red flags that should prompt return.  Pt expressed understanding and is in agreement w/ plan.

## 2015-03-19 NOTE — Assessment & Plan Note (Signed)
Blood pressure controlled. Continue present medications. 

## 2015-04-08 ENCOUNTER — Telehealth (HOSPITAL_COMMUNITY): Payer: Self-pay | Admitting: *Deleted

## 2015-04-22 ENCOUNTER — Encounter (HOSPITAL_COMMUNITY): Payer: 59

## 2015-05-26 ENCOUNTER — Telehealth: Payer: Self-pay | Admitting: Family Medicine

## 2015-05-26 MED ORDER — AMLODIPINE BESYLATE 10 MG PO TABS: 10.0000 mg | ORAL_TABLET | Freq: Every day | ORAL | Status: DC

## 2015-05-26 MED ORDER — METOPROLOL SUCCINATE ER 50 MG PO TB24: 50.0000 mg | ORAL_TABLET | Freq: Every day | ORAL | Status: DC

## 2015-05-26 NOTE — Telephone Encounter (Signed)
Please advise pt is on metoprolol Xl can she split this in half?

## 2015-05-26 NOTE — Telephone Encounter (Signed)
Med filled.  

## 2015-05-26 NOTE — Telephone Encounter (Signed)
Relation to pt: self   Call back number: [phone] [facility]acy[id number] [place] [phone] (Phone) [phone] (Fax)          Reason for call:  Pt requesting metoprolol succinate (TOPROL-XL) 100 MG so she can split in half due to the cost of Rx being cheaper then refilling the 50 MG. Pt also requesting amLODipine (NORVASC) 10 MG tablet .

## 2015-05-26 NOTE — Telephone Encounter (Signed)
Cannot split the XL b/c it is an extended release formulation

## 2015-05-29 ENCOUNTER — Encounter: Payer: Self-pay | Admitting: Family Medicine

## 2015-07-22 ENCOUNTER — Encounter: Payer: Self-pay | Admitting: Family Medicine

## 2015-07-22 ENCOUNTER — Ambulatory Visit (INDEPENDENT_AMBULATORY_CARE_PROVIDER_SITE_OTHER): Payer: 59 | Admitting: Family Medicine

## 2015-07-22 VITALS — BP 130/80 | HR 58 | Temp 98.0°F | Resp 16 | Ht 59.0 in | Wt 148.5 lb

## 2015-07-22 DIAGNOSIS — E785 Hyperlipidemia, unspecified: Secondary | ICD-10-CM

## 2015-07-22 DIAGNOSIS — I10 Essential (primary) hypertension: Secondary | ICD-10-CM | POA: Diagnosis not present

## 2015-07-22 LAB — CBC WITH DIFFERENTIAL/PLATELET
BASOS ABS: 0 10*3/uL (ref 0.0–0.1)
Basophils Relative: 0.3 % (ref 0.0–3.0)
EOS PCT: 1.2 % (ref 0.0–5.0)
Eosinophils Absolute: 0.1 10*3/uL (ref 0.0–0.7)
HEMATOCRIT: 38.5 % (ref 36.0–46.0)
Hemoglobin: 12.3 g/dL (ref 12.0–15.0)
Lymphocytes Relative: 26.9 % (ref 12.0–46.0)
Lymphs Abs: 1.7 10*3/uL (ref 0.7–4.0)
MCHC: 31.9 g/dL (ref 30.0–36.0)
MCV: 64.6 fl — ABNORMAL LOW (ref 78.0–100.0)
Monocytes Absolute: 0.5 10*3/uL (ref 0.1–1.0)
Monocytes Relative: 8 % (ref 3.0–12.0)
NEUTROS ABS: 3.9 10*3/uL (ref 1.4–7.7)
NEUTROS PCT: 63.6 % (ref 43.0–77.0)
Platelets: 257 10*3/uL (ref 150.0–400.0)
RBC: 5.96 Mil/uL — ABNORMAL HIGH (ref 3.87–5.11)
RDW: 15.9 % — ABNORMAL HIGH (ref 11.5–15.5)
WBC: 6.2 10*3/uL (ref 4.0–10.5)

## 2015-07-22 LAB — HEPATIC FUNCTION PANEL
ALT: 20 U/L (ref 0–35)
AST: 18 U/L (ref 0–37)
Albumin: 4.2 g/dL (ref 3.5–5.2)
Alkaline Phosphatase: 70 U/L (ref 39–117)
BILIRUBIN DIRECT: 0.1 mg/dL (ref 0.0–0.3)
BILIRUBIN TOTAL: 0.4 mg/dL (ref 0.2–1.2)
Total Protein: 7.2 g/dL (ref 6.0–8.3)

## 2015-07-22 LAB — BASIC METABOLIC PANEL
BUN: 16 mg/dL (ref 6–23)
CO2: 31 mEq/L (ref 19–32)
CREATININE: 0.74 mg/dL (ref 0.40–1.20)
Calcium: 9.3 mg/dL (ref 8.4–10.5)
Chloride: 99 mEq/L (ref 96–112)
GFR: 85.13 mL/min (ref >60.00)
Glucose, Bld: 106 mg/dL — ABNORMAL HIGH (ref 70–99)
Potassium: 3.8 mEq/L (ref 3.5–5.1)
SODIUM: 139 meq/L (ref 135–145)

## 2015-07-22 LAB — LIPID PANEL
CHOLESTEROL: 298 mg/dL — AB (ref 0–200)
HDL: 49.4 mg/dL (ref >39.00)
Total CHOL/HDL Ratio: 6
Triglycerides: 412 mg/dL — ABNORMAL HIGH (ref 0.0–149.0)

## 2015-07-22 LAB — LDL CHOLESTEROL, DIRECT: LDL DIRECT: 145 mg/dL

## 2015-07-22 NOTE — Progress Notes (Signed)
Pre visit review using our clinic review tool, if applicable. No additional management support is needed unless otherwise documented below in the visit note. 

## 2015-07-22 NOTE — Assessment & Plan Note (Signed)
Chronic problem.  Pt has hx of simvastatin intolerance and now having myalgias on Crestor.  Check labs.  If medication is needed, will need to switch to Zetia.  Pt expressed understanding and is in agreement w/ plan.

## 2015-07-22 NOTE — Progress Notes (Signed)
   Subjective:    Patient ID: Abigail Ellis, female    DOB: [birth year], 59 y.o.   MRN: [id number]  HPI HTN- chronic problem, well controlled on Amlodipine, HCTZ, Metoprolol.  No CP, SOB, HAs, visual changes, edema w/ exception of long days on feet.  Hyperlipidemia- chronic problem.  Pt stopped Crestor 1 month ago due to muscle aches.  No abd pain/NV.  Pt describing R sciatica that worsened on Crestor.  Improved after stopping medication.   Review of Systems For ROS see HPI     Objective:   Physical Exam  Constitutional: She is oriented to person, place, and time. She appears well-developed and well-nourished. No distress.  HENT:  Head: Normocephalic and atraumatic.  Eyes: Conjunctivae and EOM are normal. Pupils are equal, round, and reactive to light.  Neck: Normal range of motion. Neck supple. No thyromegaly present.  Cardiovascular: Normal rate, regular rhythm, normal heart sounds and intact distal pulses.   No murmur heard. Pulmonary/Chest: Effort normal and breath sounds normal. No respiratory distress.  Abdominal: Soft. She exhibits no distension. There is no tenderness.  Musculoskeletal: She exhibits no edema.  Lymphadenopathy:    She has no cervical adenopathy.  Neurological: She is alert and oriented to person, place, and time.  Skin: Skin is warm and dry.  Psychiatric: She has a normal mood and affect. Her behavior is normal.  Vitals reviewed.         Assessment & Plan:

## 2015-07-22 NOTE — Patient Instructions (Signed)
Schedule your complete physical in 6 months We'll notify you of your lab results and make any changes if needed Keep up the good work!  You look great! Call with any questions or concerns Enjoy the rest of your summer!!

## 2015-07-22 NOTE — Assessment & Plan Note (Signed)
Chronic problem.  Adequate control.  Asymptomatic.  Check labs.  No anticipated med changes.  Will follow. 

## 2015-07-23 ENCOUNTER — Other Ambulatory Visit: Payer: Self-pay | Admitting: General Practice

## 2015-07-23 MED ORDER — EZETIMIBE 10 MG PO TABS: 10.0000 mg | ORAL_TABLET | Freq: Every day | ORAL | Status: DC

## 2015-07-26 ENCOUNTER — Other Ambulatory Visit: Payer: Self-pay | Admitting: Family Medicine

## 2015-07-26 ENCOUNTER — Encounter: Payer: Self-pay | Admitting: Family Medicine

## 2015-07-28 ENCOUNTER — Encounter: Payer: Self-pay | Admitting: General Practice

## 2015-07-28 NOTE — Telephone Encounter (Signed)
Medication filled to pharmacy as requested.   

## 2015-08-22 ENCOUNTER — Other Ambulatory Visit: Payer: Self-pay | Admitting: Physician Assistant

## 2015-09-22 ENCOUNTER — Other Ambulatory Visit: Payer: Self-pay

## 2015-09-22 DIAGNOSIS — Z1231 Encounter for screening mammogram for malignant neoplasm of breast: Secondary | ICD-10-CM

## 2015-10-21 ENCOUNTER — Ambulatory Visit: Payer: 59

## 2015-10-24 ENCOUNTER — Other Ambulatory Visit: Payer: Self-pay | Admitting: Family Medicine

## 2015-10-24 NOTE — Telephone Encounter (Signed)
Medication filled to pharmacy as requested.   

## 2015-11-10 ENCOUNTER — Ambulatory Visit
Admission: RE | Admit: 2015-11-10 | Discharge: 2015-11-10 | Disposition: A | Discharge status: Home / Self Care | Payer: 59 | Source: Ambulatory Visit

## 2015-11-10 DIAGNOSIS — Z1231 Encounter for screening mammogram for malignant neoplasm of breast: Secondary | ICD-10-CM

## 2015-11-11 IMAGING — CR DG ABDOMEN 2V
2 series · 2 of 2 positions shown · non-contrast
Comparison: None.

CLINICAL DATA: Generalized abdominal pain for several days ; recent
constipation

EXAM:
ABDOMEN - 2 VIEW

[w abdomen upright]
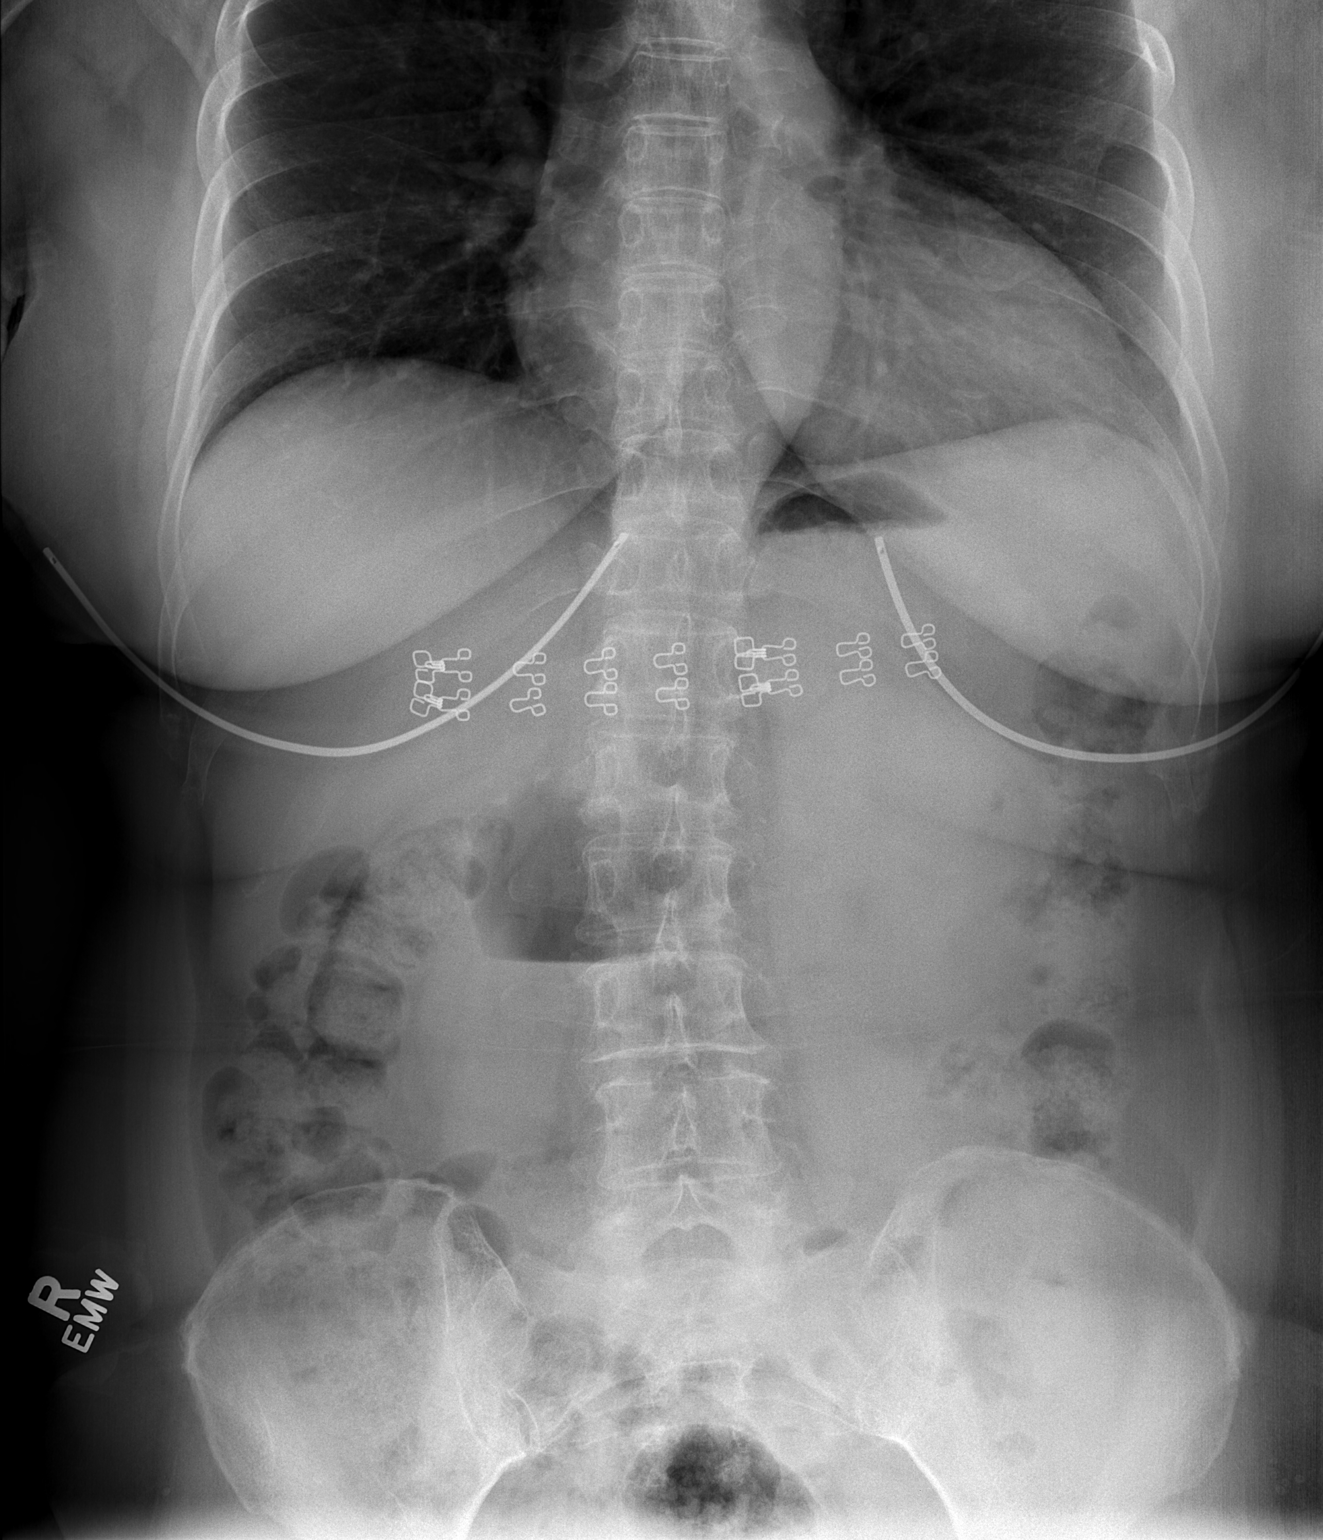

[t abdomen supine]
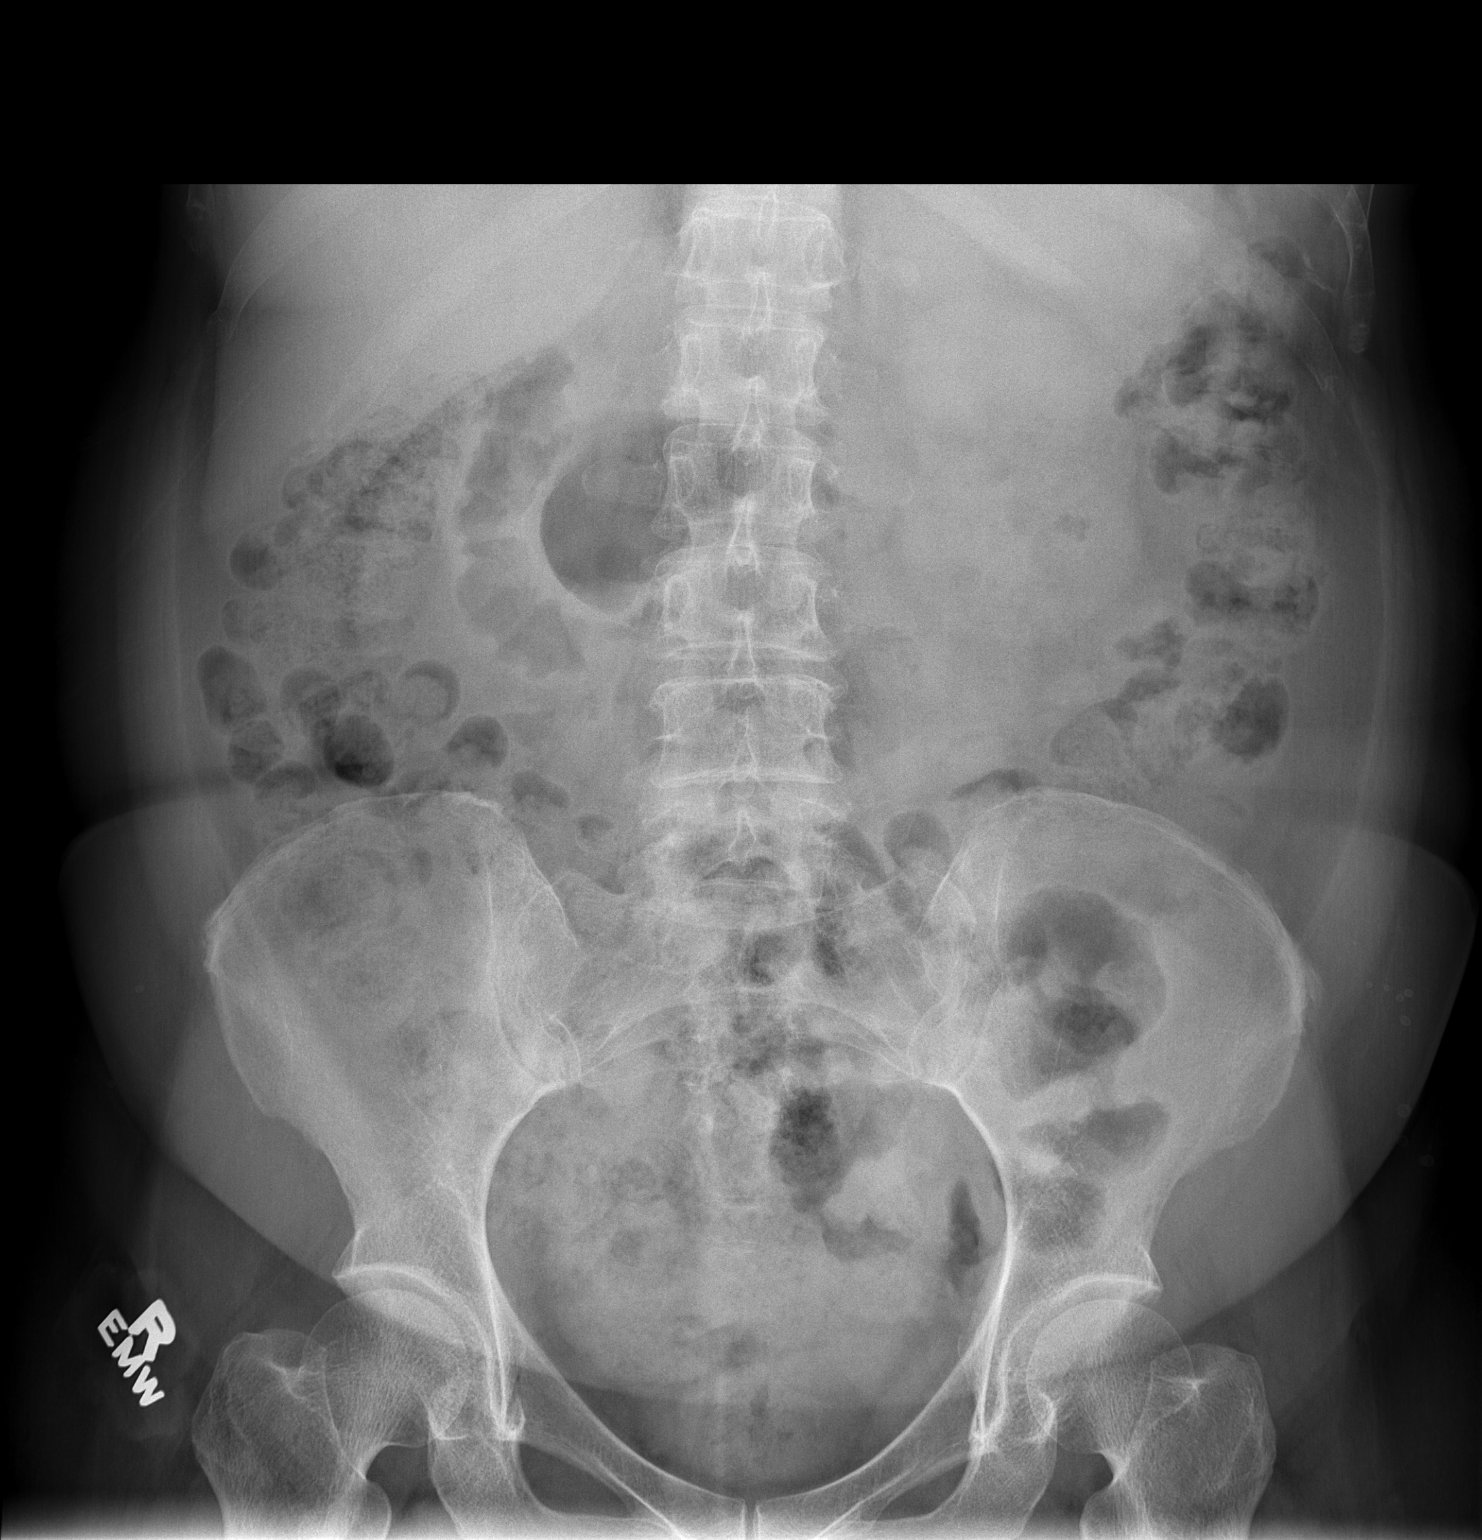

[2 of 2 positions shown; findings below may reference images not displayed]

FINDINGS: Supine and upright images were obtained. There is diffuse stool
throughout the colon and rectum. The bowel gas pattern is
unremarkable. No obstruction or free air. No abnormal
calcifications. Lung bases are clear.
IMPRESSION: Diffuse stool throughout colon compatible with stated diagnosis of
constipation. Overall bowel gas pattern unremarkable.

## 2015-11-12 ENCOUNTER — Other Ambulatory Visit: Payer: Self-pay | Admitting: Family Medicine

## 2015-11-12 DIAGNOSIS — R928 Other abnormal and inconclusive findings on diagnostic imaging of breast: Secondary | ICD-10-CM

## 2015-11-20 ENCOUNTER — Ambulatory Visit
Admission: RE | Admit: 2015-11-20 | Discharge: 2015-11-20 | Disposition: A | Discharge status: Home / Self Care | Payer: 59 | Source: Ambulatory Visit | Attending: Family Medicine | Admitting: Family Medicine

## 2015-11-20 DIAGNOSIS — R928 Other abnormal and inconclusive findings on diagnostic imaging of breast: Secondary | ICD-10-CM

## 2015-11-21 ENCOUNTER — Other Ambulatory Visit: Payer: Self-pay | Admitting: Family Medicine

## 2015-11-21 NOTE — Telephone Encounter (Signed)
Medication filled to pharmacy as requested.   

## 2016-01-22 ENCOUNTER — Telehealth: Payer: Self-pay | Admitting: Behavioral Health

## 2016-01-22 NOTE — Telephone Encounter (Signed)
Unable to reach patient at time of Pre-Visit Call.  Left message for patient to return call when available.    

## 2016-01-23 ENCOUNTER — Ambulatory Visit (INDEPENDENT_AMBULATORY_CARE_PROVIDER_SITE_OTHER): Payer: 59 | Admitting: Family Medicine

## 2016-01-23 ENCOUNTER — Encounter: Payer: Self-pay | Admitting: Family Medicine

## 2016-01-23 VITALS — BP 122/84 | HR 61 | Temp 98.0°F | Ht 59.0 in | Wt 150.4 lb

## 2016-01-23 DIAGNOSIS — Z0000 Encounter for general adult medical examination without abnormal findings: Secondary | ICD-10-CM | POA: Diagnosis not present

## 2016-01-23 DIAGNOSIS — Z23 Encounter for immunization: Secondary | ICD-10-CM

## 2016-01-23 LAB — BASIC METABOLIC PANEL
BUN: 17 mg/dL (ref 6–23)
CO2: 33 meq/L — AB (ref 19–32)
CREATININE: 0.76 mg/dL (ref 0.40–1.20)
Calcium: 9.9 mg/dL (ref 8.4–10.5)
Chloride: 101 mEq/L (ref 96–112)
GFR: 82.41 mL/min (ref >60.00)
Glucose, Bld: 111 mg/dL — ABNORMAL HIGH (ref 70–99)
POTASSIUM: 4.3 meq/L (ref 3.5–5.1)
SODIUM: 140 meq/L (ref 135–145)

## 2016-01-23 LAB — LIPID PANEL
CHOLESTEROL: 183 mg/dL (ref 0–200)
HDL: 64.6 mg/dL (ref >39.00)
LDL Cholesterol: 83 mg/dL (ref 0–99)
NONHDL: 118.19
Total CHOL/HDL Ratio: 3
Triglycerides: 177 mg/dL — ABNORMAL HIGH (ref 0.0–149.0)
VLDL: 35.4 mg/dL (ref 0.0–40.0)

## 2016-01-23 LAB — VITAMIN D 25 HYDROXY (VIT D DEFICIENCY, FRACTURES): VITD: 46.54 ng/mL (ref 30.00–100.00)

## 2016-01-23 LAB — TSH: TSH: 0.67 u[IU]/mL (ref 0.35–4.50)

## 2016-01-23 LAB — HEPATIC FUNCTION PANEL
ALK PHOS: 66 U/L (ref 39–117)
ALT: 25 U/L (ref 0–35)
AST: 21 U/L (ref 0–37)
Albumin: 4.4 g/dL (ref 3.5–5.2)
BILIRUBIN DIRECT: 0.1 mg/dL (ref 0.0–0.3)
BILIRUBIN TOTAL: 0.4 mg/dL (ref 0.2–1.2)
Total Protein: 7.5 g/dL (ref 6.0–8.3)

## 2016-01-23 NOTE — Progress Notes (Signed)
   Subjective:    Patient ID: Abigail Ellis, female    DOB: 07-03-1956, 60 y.o.   MRN: [id number]  HPI CPE- UTD on colonoscopy, mammo.  Pt declines pap today- prefers to wait.   Review of Systems Patient reports no vision/ hearing changes, adenopathy,fever, weight change,  persistant/recurrent hoarseness , swallowing issues, chest pain, palpitations, edema, persistant/recurrent cough, hemoptysis, dyspnea (rest/exertional/paroxysmal nocturnal), gastrointestinal bleeding (melena, rectal bleeding), abdominal pain, significant heartburn, bowel changes, GU symptoms (dysuria, hematuria, incontinence), Gyn symptoms (abnormal  bleeding, pain),  syncope, focal weakness, memory loss, numbness & tingling, skin/hair/nail changes, abnormal bruising or bleeding, anxiety, or depression.     Objective:   Physical Exam General Appearance:    Alert, cooperative, no distress, appears stated age  Head:    Normocephalic, without obvious abnormality, atraumatic  Eyes:    PERRL, conjunctiva/corneas clear, EOM's intact, fundi    benign, both eyes  Ears:    Normal TM's and external ear canals, both ears  Nose:   Nares normal, septum midline, mucosa normal, no drainage    or sinus tenderness  Throat:   Lips, mucosa, and tongue normal; teeth and gums normal  Neck:   Supple, symmetrical, trachea midline, no adenopathy;    Thyroid: no enlargement/tenderness/nodules  Back:     Symmetric, no curvature, ROM normal, no CVA tenderness  Lungs:     Clear to auscultation bilaterally, respirations unlabored  Chest Wall:    No tenderness or deformity   Heart:    Regular rate and rhythm, S1 and S2 normal, no murmur, rub   or gallop  Breast Exam:    Deferred to mammo  Abdomen:     Soft, non-tender, bowel sounds active all four quadrants,    no masses, no organomegaly  Genitalia:    Deferred at pt request  Rectal:    Extremities:   Extremities normal, atraumatic, no cyanosis or edema  Pulses:   2+ and symmetric all extremities   Skin:   Skin color, texture, turgor normal, no rashes or lesions  Lymph nodes:   Cervical, supraclavicular, and axillary nodes normal  Neurologic:   CNII-XII intact, normal strength, sensation and reflexes    throughout          Assessment & Plan:

## 2016-01-23 NOTE — Addendum Note (Signed)
Addended by: Bunnie Domino on: 01/23/2016 09:39 AM   Modules accepted: Orders

## 2016-01-23 NOTE — Patient Instructions (Signed)
Follow up in 6 months to recheck BP and cholesterol (we'll also do your pap) We'll notify you of your lab results and make any changes if needed Continue to work on healthy diet and regular exercise- you can do it! Call with any questions or concerns If you want to join [place] at the new [place] office, any scheduled appointments will automatically transfer and we will see you at [address] [place] [address] [place], [place] [address] [facility]) Happy Valentine's Day!!!

## 2016-01-23 NOTE — Assessment & Plan Note (Signed)
Pt's PE WNL.  UTD on colonoscopy, mammo.  Due for pap but pt prefers to do this in 6 months.  Due for Tdap.  Check labs.  Anticipatory guidance provided.

## 2016-01-23 NOTE — Progress Notes (Signed)
Pre visit review using our clinic review tool, if applicable. No additional management support is needed unless otherwise documented below in the visit note. 

## 2016-01-24 LAB — CBC WITH DIFFERENTIAL/PLATELET
BASOS ABS: 0 10*3/uL (ref 0.0–0.1)
Basophils Relative: 0.3 % (ref 0.0–3.0)
EOS PCT: 1.5 % (ref 0.0–5.0)
Eosinophils Absolute: 0.1 10*3/uL (ref 0.0–0.7)
HEMATOCRIT: 39.6 % (ref 36.0–46.0)
Hemoglobin: 11.9 g/dL — ABNORMAL LOW (ref 12.0–15.0)
LYMPHS ABS: 1.5 10*3/uL (ref 0.7–4.0)
LYMPHS PCT: 24.9 % (ref 12.0–46.0)
MCHC: 30.1 g/dL (ref 30.0–36.0)
MCV: 67.5 fl — AB (ref 78.0–100.0)
MONOS PCT: 7.1 % (ref 3.0–12.0)
Monocytes Absolute: 0.4 10*3/uL (ref 0.1–1.0)
NEUTROS PCT: 66.2 % (ref 43.0–77.0)
Neutro Abs: 4 10*3/uL (ref 1.4–7.7)
Platelets: 225 10*3/uL (ref 150.0–400.0)
RBC: 5.87 Mil/uL — AB (ref 3.87–5.11)
RDW: 16.1 % — ABNORMAL HIGH (ref 11.5–15.5)
WBC: 6 10*3/uL (ref 4.0–10.5)

## 2016-01-26 ENCOUNTER — Other Ambulatory Visit (INDEPENDENT_AMBULATORY_CARE_PROVIDER_SITE_OTHER): Payer: 59

## 2016-01-26 DIAGNOSIS — R7309 Other abnormal glucose: Secondary | ICD-10-CM | POA: Diagnosis not present

## 2016-01-26 LAB — HEMOGLOBIN A1C: Hgb A1c MFr Bld: 6.5 % (ref 4.6–6.5)

## 2016-01-27 ENCOUNTER — Encounter: Payer: Self-pay | Admitting: General Practice

## 2016-02-02 ENCOUNTER — Ambulatory Visit (INDEPENDENT_AMBULATORY_CARE_PROVIDER_SITE_OTHER): Payer: 59 | Admitting: Family Medicine

## 2016-02-02 ENCOUNTER — Encounter: Payer: Self-pay | Admitting: Family Medicine

## 2016-02-02 VITALS — BP 104/80 | HR 72 | Temp 98.6°F | Ht 59.0 in | Wt 147.0 lb

## 2016-02-02 DIAGNOSIS — J069 Acute upper respiratory infection, unspecified: Secondary | ICD-10-CM

## 2016-02-02 MED ORDER — ALBUTEROL SULFATE (2.5 MG/3ML) 0.083% IN NEBU
2.5000 mg | INHALATION_SOLUTION | Freq: Once | RESPIRATORY_TRACT | Status: AC
  Administered 2016-02-02: 2.5 mg via RESPIRATORY_TRACT

## 2016-02-02 MED ORDER — ALBUTEROL SULFATE HFA 108 (90 BASE) MCG/ACT IN AERS: 2.0000 | INHALATION_SPRAY | Freq: Four times a day (QID) | RESPIRATORY_TRACT | Status: DC | PRN

## 2016-02-02 MED ORDER — PROMETHAZINE-DM 6.25-15 MG/5ML PO SYRP: 5.0000 mL | ORAL_SOLUTION | Freq: Four times a day (QID) | ORAL | Status: DC | PRN

## 2016-02-02 MED ORDER — AZITHROMYCIN 250 MG PO TABS: ORAL_TABLET | ORAL | Status: DC

## 2016-02-02 NOTE — Progress Notes (Signed)
Pre visit review using our clinic review tool, if applicable. No additional management support is needed unless otherwise documented below in the visit note. 

## 2016-02-02 NOTE — Progress Notes (Signed)
   Subjective:    Patient ID: Abigail Ellis, female    DOB: [birth year], 60 y.o.   MRN: [id number]  HPI URI- sxs started ~3 days ago.  No relief w/ OTC meds.  Pt reports chest congestion, SOB, cough- productive.  No fevers.  + sinus pain/pressure.  HA w/ cough.  No ear pain.  No N/V.  + sick contacts.   Review of Systems For ROS see HPI     Objective:   Physical Exam  Constitutional: She appears well-developed and well-nourished. No distress.  HENT:  Head: Normocephalic and atraumatic.  TMs normal bilaterally Mild nasal congestion Throat w/out erythema, edema, or exudate  Eyes: Conjunctivae and EOM are normal. Pupils are equal, round, and reactive to light.  Neck: Normal range of motion. Neck supple.  Cardiovascular: Normal rate, regular rhythm, normal heart sounds and intact distal pulses.   No murmur heard. Pulmonary/Chest: Effort normal and breath sounds normal. No respiratory distress. She has no wheezes.  + hacking cough Decreased air movement throughout- improved s/p neb tx  Lymphadenopathy:    She has no cervical adenopathy.  Vitals reviewed.         Assessment & Plan:

## 2016-02-02 NOTE — Patient Instructions (Signed)
Follow up as needed Start the Zpack as directed Use the albuterol inhaler for cough, wheezing, or shortness of breath- 2 puffs every 4 hrs Use the cough syrup for nights/weekends (will cause drowsiness) Mucinex DM for daytime cough/congestion REST! Call with any questions or concerns Hang in there!!

## 2016-02-02 NOTE — Assessment & Plan Note (Signed)
Pt's sxs and PE consistent w/ acute respiratory illness.  Son is recovering from bronchitis.  Air movement improved s/p neb tx in office.  Start abx.  Cough meds prn.  Albuterol prn.  Reviewed supportive care and red flags that should prompt return.  Pt expressed understanding and is in agreement w/ plan.

## 2016-02-16 ENCOUNTER — Telehealth: Payer: Self-pay | Admitting: Family Medicine

## 2016-02-16 NOTE — Telephone Encounter (Signed)
Chart updated to reflect 

## 2016-02-16 NOTE — Telephone Encounter (Signed)
Patient received flu shot thru her [employer] [facility] [employer] October 2016

## 2016-03-26 ENCOUNTER — Encounter: Payer: Self-pay | Admitting: Family Medicine

## 2016-03-29 MED ORDER — EZETIMIBE 10 MG PO TABS: 10.0000 mg | ORAL_TABLET | Freq: Every day | ORAL | Status: DC

## 2016-03-29 NOTE — Telephone Encounter (Signed)
Medication filled to pharmacy as requested.   

## 2016-04-26 ENCOUNTER — Other Ambulatory Visit: Payer: Self-pay | Admitting: General Practice

## 2016-04-26 MED ORDER — HYDROCHLOROTHIAZIDE 12.5 MG PO CAPS: 12.5000 mg | ORAL_CAPSULE | Freq: Every day | ORAL | Status: DC

## 2016-06-01 ENCOUNTER — Other Ambulatory Visit: Payer: Self-pay | Admitting: Physician Assistant

## 2016-06-18 ENCOUNTER — Other Ambulatory Visit: Payer: Self-pay | Admitting: Family Medicine

## 2016-06-18 NOTE — Telephone Encounter (Signed)
Medication filled to pharmacy as requested.   

## 2016-07-22 ENCOUNTER — Ambulatory Visit: Payer: 59 | Admitting: Family Medicine

## 2016-07-28 ENCOUNTER — Encounter: Payer: Self-pay | Admitting: Family Medicine

## 2016-07-28 ENCOUNTER — Ambulatory Visit (INDEPENDENT_AMBULATORY_CARE_PROVIDER_SITE_OTHER): Payer: 59 | Admitting: Family Medicine

## 2016-07-28 ENCOUNTER — Other Ambulatory Visit (HOSPITAL_COMMUNITY)
Admission: RE | Admit: 2016-07-28 | Discharge: 2016-07-28 | Disposition: A | Discharge status: Home / Self Care | Payer: 59 | Source: Ambulatory Visit | Attending: Family Medicine | Admitting: Family Medicine

## 2016-07-28 VITALS — BP 110/76 | HR 83 | Temp 97.9°F | Resp 16 | Ht 59.0 in | Wt 145.2 lb

## 2016-07-28 DIAGNOSIS — Z01419 Encounter for gynecological examination (general) (routine) without abnormal findings: Secondary | ICD-10-CM | POA: Insufficient documentation

## 2016-07-28 DIAGNOSIS — R7989 Other specified abnormal findings of blood chemistry: Secondary | ICD-10-CM

## 2016-07-28 DIAGNOSIS — I10 Essential (primary) hypertension: Secondary | ICD-10-CM

## 2016-07-28 DIAGNOSIS — Z1151 Encounter for screening for human papillomavirus (HPV): Secondary | ICD-10-CM | POA: Insufficient documentation

## 2016-07-28 DIAGNOSIS — E785 Hyperlipidemia, unspecified: Secondary | ICD-10-CM | POA: Diagnosis not present

## 2016-07-28 DIAGNOSIS — Z124 Encounter for screening for malignant neoplasm of cervix: Secondary | ICD-10-CM | POA: Diagnosis not present

## 2016-07-28 LAB — LIPID PANEL
CHOL/HDL RATIO: 3
CHOLESTEROL: 175 mg/dL (ref 0–200)
HDL: 69.4 mg/dL (ref >39.00)
NonHDL: 105.29
Triglycerides: 209 mg/dL — ABNORMAL HIGH (ref 0.0–149.0)
VLDL: 41.8 mg/dL — AB (ref 0.0–40.0)

## 2016-07-28 LAB — CBC WITH DIFFERENTIAL/PLATELET
BASOS ABS: 0 10*3/uL (ref 0.0–0.1)
Basophils Relative: 0.4 % (ref 0.0–3.0)
Eosinophils Absolute: 0.1 10*3/uL (ref 0.0–0.7)
Eosinophils Relative: 0.7 % (ref 0.0–5.0)
HEMATOCRIT: 40.4 % (ref 36.0–46.0)
Hemoglobin: 12.7 g/dL (ref 12.0–15.0)
LYMPHS PCT: 20.9 % (ref 12.0–46.0)
Lymphs Abs: 1.6 10*3/uL (ref 0.7–4.0)
MCHC: 31.4 g/dL (ref 30.0–36.0)
MONOS PCT: 7.2 % (ref 3.0–12.0)
Monocytes Absolute: 0.5 10*3/uL (ref 0.1–1.0)
NEUTROS ABS: 5.3 10*3/uL (ref 1.4–7.7)
NEUTROS PCT: 70.8 % (ref 43.0–77.0)
PLATELETS: 264 10*3/uL (ref 150.0–400.0)
RBC: 6.23 Mil/uL — ABNORMAL HIGH (ref 3.87–5.11)
RDW: 15.7 % — ABNORMAL HIGH (ref 11.5–15.5)
WBC: 7.5 10*3/uL (ref 4.0–10.5)

## 2016-07-28 LAB — BASIC METABOLIC PANEL
BUN: 15 mg/dL (ref 6–23)
CHLORIDE: 100 meq/L (ref 96–112)
CO2: 33 meq/L — AB (ref 19–32)
Calcium: 10.1 mg/dL (ref 8.4–10.5)
Creatinine, Ser: 0.74 mg/dL (ref 0.40–1.20)
GFR: 84.84 mL/min (ref >60.00)
GLUCOSE: 103 mg/dL — AB (ref 70–99)
POTASSIUM: 3.8 meq/L (ref 3.5–5.1)
Sodium: 139 mEq/L (ref 135–145)

## 2016-07-28 LAB — HEPATIC FUNCTION PANEL
ALT: 29 U/L (ref 0–35)
AST: 24 U/L (ref 0–37)
Albumin: 4.8 g/dL (ref 3.5–5.2)
Alkaline Phosphatase: 70 U/L (ref 39–117)
BILIRUBIN DIRECT: 0.1 mg/dL (ref 0.0–0.3)
BILIRUBIN TOTAL: 0.6 mg/dL (ref 0.2–1.2)
TOTAL PROTEIN: 7.5 g/dL (ref 6.0–8.3)

## 2016-07-28 LAB — LDL CHOLESTEROL, DIRECT: LDL DIRECT: 71 mg/dL

## 2016-07-28 NOTE — Assessment & Plan Note (Signed)
Chronic problem.  Intolerant to statins.  Currently on Zetia w/o difficulty.  Check labs.  Adjust meds prn

## 2016-07-28 NOTE — Progress Notes (Signed)
Pre visit review using our clinic review tool, if applicable. No additional management support is needed unless otherwise documented below in the visit note. 

## 2016-07-28 NOTE — Patient Instructions (Signed)
Schedule your complete physical in 6 months We'll notify you of your lab results and make any changes if needed Continue to work on healthy diet and regular exercise- you look great! Call with any questions or concerns Happy Labor Day!!!

## 2016-07-28 NOTE — Progress Notes (Signed)
   Subjective:    Patient ID: Abigail Ellis, female    DOB: [birth year], 60 y.o.   MRN: [id number]  HPI HTN- chronic problem, on HCTZ, Norvasc, Toprol w/ good control.  Denies CP, SOB, HAs, visual changes, edema.  Hyperlipidemia- chronic problem, on Zetia daily.  Denies abd pain, N/V, myalgias.  Pap- due today.  No concerns today.   Review of Systems For ROS see HPI     Objective:   Physical Exam  Constitutional: She is oriented to person, place, and time. She appears well-developed and well-nourished. No distress.  HENT:  Head: Normocephalic and atraumatic.  Eyes: Conjunctivae and EOM are normal. Pupils are equal, round, and reactive to light.  Neck: Normal range of motion. Neck supple. No thyromegaly present.  Cardiovascular: Normal rate, regular rhythm, normal heart sounds and intact distal pulses.   No murmur heard. Pulmonary/Chest: Effort normal and breath sounds normal. No respiratory distress.  Abdominal: Soft. She exhibits no distension. There is no tenderness.  Genitourinary: Vagina normal. Rectal exam shows no external hemorrhoid and anal tone normal. Uterus is not deviated, not enlarged, not fixed and not tender. Cervix exhibits no motion tenderness, no discharge and no friability. Right adnexum displays no mass, no tenderness and no fullness. Left adnexum displays no mass, no tenderness and no fullness. No erythema, tenderness or bleeding in the vagina. No foreign body in the vagina. No vaginal discharge found.  Musculoskeletal: She exhibits no edema.  Lymphadenopathy:    She has no cervical adenopathy.  Neurological: She is alert and oriented to person, place, and time.  Skin: Skin is warm and dry.  Psychiatric: She has a normal mood and affect. Her behavior is normal.  Vitals reviewed.         Assessment & Plan:

## 2016-07-28 NOTE — Assessment & Plan Note (Signed)
Chronic problem.  Well controlled.  Asymptomatic.  Check labs.  No anticipated med changes.  Will follow. 

## 2016-07-28 NOTE — Assessment & Plan Note (Signed)
Pap collected. 

## 2016-07-30 ENCOUNTER — Other Ambulatory Visit: Payer: Self-pay | Admitting: Family Medicine

## 2016-07-30 LAB — CYTOLOGY - PAP

## 2016-10-29 ENCOUNTER — Other Ambulatory Visit: Payer: Self-pay | Admitting: Family Medicine

## 2016-11-01 ENCOUNTER — Other Ambulatory Visit: Payer: Self-pay | Admitting: Family Medicine

## 2016-11-01 DIAGNOSIS — Z1231 Encounter for screening mammogram for malignant neoplasm of breast: Secondary | ICD-10-CM

## 2016-11-23 ENCOUNTER — Ambulatory Visit
Admission: RE | Admit: 2016-11-23 | Discharge: 2016-11-23 | Disposition: A | Discharge status: Home / Self Care | Payer: 59 | Source: Ambulatory Visit | Attending: Family Medicine | Admitting: Family Medicine

## 2016-11-23 DIAGNOSIS — Z1231 Encounter for screening mammogram for malignant neoplasm of breast: Secondary | ICD-10-CM

## 2017-01-21 ENCOUNTER — Other Ambulatory Visit: Payer: Self-pay | Admitting: Family Medicine

## 2017-01-26 ENCOUNTER — Ambulatory Visit (INDEPENDENT_AMBULATORY_CARE_PROVIDER_SITE_OTHER): Payer: 59 | Admitting: Family Medicine

## 2017-01-26 ENCOUNTER — Telehealth: Payer: Self-pay | Admitting: Family Medicine

## 2017-01-26 ENCOUNTER — Encounter: Payer: Self-pay | Admitting: Family Medicine

## 2017-01-26 VITALS — BP 113/78 | HR 63 | Temp 98.1°F | Resp 17 | Ht 59.0 in | Wt 149.2 lb

## 2017-01-26 DIAGNOSIS — R7989 Other specified abnormal findings of blood chemistry: Secondary | ICD-10-CM

## 2017-01-26 DIAGNOSIS — Z0000 Encounter for general adult medical examination without abnormal findings: Secondary | ICD-10-CM

## 2017-01-26 LAB — CBC WITH DIFFERENTIAL/PLATELET
BASOS PCT: 0.5 % (ref 0.0–3.0)
Basophils Absolute: 0 10*3/uL (ref 0.0–0.1)
EOS ABS: 0.1 10*3/uL (ref 0.0–0.7)
EOS PCT: 1.2 % (ref 0.0–5.0)
HCT: 38.3 % (ref 36.0–46.0)
HEMOGLOBIN: 11.9 g/dL — AB (ref 12.0–15.0)
Lymphocytes Relative: 21.5 % (ref 12.0–46.0)
Lymphs Abs: 1.5 10*3/uL (ref 0.7–4.0)
MCHC: 31.1 g/dL (ref 30.0–36.0)
MCV: 64.9 fl — ABNORMAL LOW (ref 78.0–100.0)
MONO ABS: 0.7 10*3/uL (ref 0.1–1.0)
Monocytes Relative: 9.3 % (ref 3.0–12.0)
NEUTROS ABS: 4.8 10*3/uL (ref 1.4–7.7)
Neutrophils Relative %: 67.5 % (ref 43.0–77.0)
Platelets: 243 10*3/uL (ref 150.0–400.0)
RBC: 5.91 Mil/uL — ABNORMAL HIGH (ref 3.87–5.11)
RDW: 15.8 % — AB (ref 11.5–15.5)
WBC: 7 10*3/uL (ref 4.0–10.5)

## 2017-01-26 LAB — LIPID PANEL
CHOLESTEROL: 198 mg/dL (ref 0–200)
HDL: 64.1 mg/dL (ref >39.00)
NonHDL: 133.78
Total CHOL/HDL Ratio: 3
Triglycerides: 208 mg/dL — ABNORMAL HIGH (ref 0.0–149.0)
VLDL: 41.6 mg/dL — AB (ref 0.0–40.0)

## 2017-01-26 LAB — BASIC METABOLIC PANEL
BUN: 14 mg/dL (ref 6–23)
CO2: 32 mEq/L (ref 19–32)
Calcium: 9.5 mg/dL (ref 8.4–10.5)
Chloride: 101 mEq/L (ref 96–112)
Creatinine, Ser: 0.66 mg/dL (ref 0.40–1.20)
GFR: 96.66 mL/min (ref >60.00)
GLUCOSE: 103 mg/dL — AB (ref 70–99)
POTASSIUM: 4 meq/L (ref 3.5–5.1)
Sodium: 139 mEq/L (ref 135–145)

## 2017-01-26 LAB — HEPATIC FUNCTION PANEL
ALBUMIN: 4.4 g/dL (ref 3.5–5.2)
ALK PHOS: 73 U/L (ref 39–117)
ALT: 25 U/L (ref 0–35)
AST: 18 U/L (ref 0–37)
Bilirubin, Direct: 0.1 mg/dL (ref 0.0–0.3)
Total Bilirubin: 0.4 mg/dL (ref 0.2–1.2)
Total Protein: 7 g/dL (ref 6.0–8.3)

## 2017-01-26 LAB — TSH: TSH: 0.63 u[IU]/mL (ref 0.35–4.50)

## 2017-01-26 LAB — LDL CHOLESTEROL, DIRECT: LDL DIRECT: 90 mg/dL

## 2017-01-26 LAB — VITAMIN D 25 HYDROXY (VIT D DEFICIENCY, FRACTURES): VITD: 38.4 ng/mL (ref 30.00–100.00)

## 2017-01-26 MED ORDER — ROSUVASTATIN CALCIUM 10 MG PO TABS: 10.0000 mg | ORAL_TABLET | Freq: Every day | ORAL | 6 refills | Status: DC

## 2017-01-26 MED ORDER — AMLODIPINE BESYLATE 10 MG PO TABS: 10.0000 mg | ORAL_TABLET | Freq: Every day | ORAL | 6 refills | Status: DC

## 2017-01-26 MED ORDER — EZETIMIBE 10 MG PO TABS: 10.0000 mg | ORAL_TABLET | Freq: Every day | ORAL | 6 refills | Status: DC

## 2017-01-26 MED ORDER — HYDROCHLOROTHIAZIDE 12.5 MG PO CAPS: 12.5000 mg | ORAL_CAPSULE | Freq: Every day | ORAL | 5 refills | Status: DC

## 2017-01-26 MED ORDER — METOPROLOL SUCCINATE ER 50 MG PO TB24: ORAL_TABLET | ORAL | 6 refills | Status: DC

## 2017-01-26 NOTE — Patient Instructions (Signed)
Follow up in 6 months to recheck BP and cholesterol We'll notify you of your lab results and make any changes if needed Continue to work on healthy diet and regular exercise You are up to date on pap and mammo- yay! You are up to date on colonoscopy Call with any questions or concerns Happy Valentine's Day!!!

## 2017-01-26 NOTE — Telephone Encounter (Signed)
Patient called to request that we fwd the prescription for rosuvastatin (CRESTOR) 10 MG tablet [id number]   to [facility] on [address] it is non covered at [facility]. Please call patient to advise.   Patient states that it is also cheaper for her to get a new script for TOPROL XL 50 MG 24 hr tablet [id number]  . She states that if she can get 100 mg and split them in half, it would save her substantial money

## 2017-01-26 NOTE — Progress Notes (Signed)
   Subjective:    Patient ID: Abigail Ellis, female    DOB: [birth year], 61 y.o.   MRN: [id number]  HPI CPE- UTD on pap, mammo, colonoscopy, immunizations.   Review of Systems Patient reports no vision/ hearing changes, adenopathy,fever, weight change,  persistant/recurrent hoarseness , swallowing issues, chest pain, palpitations, edema, persistant/recurrent cough, hemoptysis, dyspnea (rest/exertional/paroxysmal nocturnal), gastrointestinal bleeding (melena, rectal bleeding), abdominal pain, significant heartburn, bowel changes, GU symptoms (dysuria, hematuria, incontinence), Gyn symptoms (abnormal  bleeding, pain),  syncope, focal weakness, memory loss, numbness & tingling, skin/hair/nail changes, abnormal bruising or bleeding, anxiety, or depression.     Objective:   Physical Exam General Appearance:    Alert, cooperative, no distress, appears stated age  Head:    Normocephalic, without obvious abnormality, atraumatic  Eyes:    PERRL, conjunctiva/corneas clear, EOM's intact, fundi    benign, both eyes  Ears:    Normal TM's and external ear canals, both ears  Nose:   Nares normal, septum midline, mucosa normal, no drainage    or sinus tenderness  Throat:   Lips, mucosa, and tongue normal; teeth and gums normal  Neck:   Supple, symmetrical, trachea midline, no adenopathy;    Thyroid: no enlargement/tenderness/nodules  Back:     Symmetric, no curvature, ROM normal, no CVA tenderness  Lungs:     Clear to auscultation bilaterally, respirations unlabored  Chest Wall:    No tenderness or deformity   Heart:    Regular rate and rhythm, S1 and S2 normal, no murmur, rub   or gallop  Breast Exam:    Deferred to mammo  Abdomen:     Soft, non-tender, bowel sounds active all four quadrants,    no masses, no organomegaly  Genitalia:    Deferred  Rectal:    Extremities:   Extremities normal, atraumatic, no cyanosis or edema  Pulses:   2+ and symmetric all extremities  Skin:   Skin color, texture,  turgor normal, no rashes or lesions  Lymph nodes:   Cervical, supraclavicular, and axillary nodes normal  Neurologic:   CNII-XII intact, normal strength, sensation and reflexes    throughout          Assessment & Plan:

## 2017-01-26 NOTE — Telephone Encounter (Signed)
Ok to send Crestor to [facility].  Unfortunately we cannot split the metoprolol b/c it's extended release

## 2017-01-26 NOTE — Assessment & Plan Note (Signed)
Pt's PE WNL w/ exception of being overweight.  UTD on pap, mammo, colonoscopy.  UTD on immunizations.  Check labs.  Anticipatory guidance provided.

## 2017-01-26 NOTE — Progress Notes (Signed)
Pre visit review using our clinic review tool, if applicable. No additional management support is needed unless otherwise documented below in the visit note. 

## 2017-01-26 NOTE — Telephone Encounter (Signed)
Pt notified and med filled.  

## 2017-02-01 ENCOUNTER — Encounter: Payer: Self-pay | Admitting: General Practice

## 2017-05-31 LAB — HM DIABETES EYE EXAM

## 2017-07-26 ENCOUNTER — Encounter: Payer: Self-pay | Admitting: Family Medicine

## 2017-07-26 ENCOUNTER — Ambulatory Visit (INDEPENDENT_AMBULATORY_CARE_PROVIDER_SITE_OTHER): Payer: 59 | Admitting: Family Medicine

## 2017-07-26 VITALS — BP 121/83 | HR 78 | Temp 98.3°F | Resp 16 | Ht 59.0 in | Wt 147.2 lb

## 2017-07-26 DIAGNOSIS — I10 Essential (primary) hypertension: Secondary | ICD-10-CM

## 2017-07-26 DIAGNOSIS — E785 Hyperlipidemia, unspecified: Secondary | ICD-10-CM | POA: Diagnosis not present

## 2017-07-26 LAB — CBC WITH DIFFERENTIAL/PLATELET
BASOS PCT: 0.7 % (ref 0.0–3.0)
Basophils Absolute: 0 10*3/uL (ref 0.0–0.1)
EOS ABS: 0.1 10*3/uL (ref 0.0–0.7)
Eosinophils Relative: 2 % (ref 0.0–5.0)
HEMATOCRIT: 38.2 % (ref 36.0–46.0)
HEMOGLOBIN: 11.8 g/dL — AB (ref 12.0–15.0)
LYMPHS PCT: 22.1 % (ref 12.0–46.0)
Lymphs Abs: 1.3 10*3/uL (ref 0.7–4.0)
MCHC: 30.8 g/dL (ref 30.0–36.0)
MONOS PCT: 8.7 % (ref 3.0–12.0)
Monocytes Absolute: 0.5 10*3/uL (ref 0.1–1.0)
Neutro Abs: 3.9 10*3/uL (ref 1.4–7.7)
Neutrophils Relative %: 66.5 % (ref 43.0–77.0)
Platelets: 211 10*3/uL (ref 150.0–400.0)
RBC: 5.8 Mil/uL — ABNORMAL HIGH (ref 3.87–5.11)
RDW: 15.9 % — AB (ref 11.5–15.5)
WBC: 5.9 10*3/uL (ref 4.0–10.5)

## 2017-07-26 LAB — BASIC METABOLIC PANEL
BUN: 13 mg/dL (ref 6–23)
CO2: 31 meq/L (ref 19–32)
Calcium: 9.6 mg/dL (ref 8.4–10.5)
Chloride: 102 mEq/L (ref 96–112)
Creatinine, Ser: 0.63 mg/dL (ref 0.40–1.20)
GFR: 101.82 mL/min (ref >60.00)
GLUCOSE: 120 mg/dL — AB (ref 70–99)
POTASSIUM: 3.6 meq/L (ref 3.5–5.1)
SODIUM: 141 meq/L (ref 135–145)

## 2017-07-26 LAB — HEPATIC FUNCTION PANEL
ALT: 25 U/L (ref 0–35)
AST: 24 U/L (ref 0–37)
Albumin: 4.6 g/dL (ref 3.5–5.2)
Alkaline Phosphatase: 57 U/L (ref 39–117)
Bilirubin, Direct: 0.1 mg/dL (ref 0.0–0.3)
TOTAL PROTEIN: 6.3 g/dL (ref 6.0–8.3)
Total Bilirubin: 0.5 mg/dL (ref 0.2–1.2)

## 2017-07-26 LAB — LIPID PANEL
CHOLESTEROL: 138 mg/dL (ref 0–200)
HDL: 63 mg/dL (ref >39.00)
LDL Cholesterol: 49 mg/dL (ref 0–99)
NONHDL: 75.01
Total CHOL/HDL Ratio: 2
Triglycerides: 130 mg/dL (ref 0.0–149.0)
VLDL: 26 mg/dL (ref 0.0–40.0)

## 2017-07-26 LAB — TSH: TSH: 0.64 u[IU]/mL (ref 0.35–4.50)

## 2017-07-26 NOTE — Assessment & Plan Note (Signed)
Chronic problem.  Tolerating statin and Zetia w/o difficulty.  Now walking regularly.  Applauded her efforts.  Check labs.  Adjust meds prn

## 2017-07-26 NOTE — Progress Notes (Signed)
Pre visit review using our clinic review tool, if applicable. No additional management support is needed unless otherwise documented below in the visit note. 

## 2017-07-26 NOTE — Assessment & Plan Note (Signed)
Chronic problem.  Well controlled today.  Asymptomatic.  Check labs.  No anticipated med changes.  Will follow. 

## 2017-07-26 NOTE — Progress Notes (Signed)
   Subjective:    Patient ID: Abigail Ellis, female    DOB: 27-Apr-1956, 61 y.o.   MRN: [id number]  HPI HTN- chronic problem, on Amlodipine 10mg  daily, HCTZ 12.5mg  daily, and Metoprolol 50mg  daily w/ good control.  No CP, SOB, HAs, visual changes, edema.  Hyperlipidemia- chronic problem, on Crestor and Zetia daily.  Pt is walking regularly in attempts to lose weight.  No abd pain, N/V.   Review of Systems For ROS see HPI     Objective:   Physical Exam  Constitutional: She is oriented to person, place, and time. She appears well-developed and well-nourished. No distress.  HENT:  Head: Normocephalic and atraumatic.  Eyes: Pupils are equal, round, and reactive to light. Conjunctivae and EOM are normal.  Neck: Normal range of motion. Neck supple. No thyromegaly present.  Cardiovascular: Normal rate, regular rhythm, normal heart sounds and intact distal pulses.   No murmur heard. Pulmonary/Chest: Effort normal and breath sounds normal. No respiratory distress.  Abdominal: Soft. She exhibits no distension. There is no tenderness.  Musculoskeletal: She exhibits no edema.  Lymphadenopathy:    She has no cervical adenopathy.  Neurological: She is alert and oriented to person, place, and time.  Skin: Skin is warm and dry.  Psychiatric: She has a normal mood and affect. Her behavior is normal.  Vitals reviewed.         Assessment & Plan:

## 2017-07-26 NOTE — Patient Instructions (Signed)
Schedule your complete physical in 6 months We'll notify you of your lab results and make any changes if needed Continue to work on healthy diet and regular exercise- you look great!!! Call with any questions or concerns Enjoy the rest of your summer!!!

## 2017-07-27 ENCOUNTER — Other Ambulatory Visit (INDEPENDENT_AMBULATORY_CARE_PROVIDER_SITE_OTHER): Payer: 59

## 2017-07-27 DIAGNOSIS — R7309 Other abnormal glucose: Secondary | ICD-10-CM

## 2017-07-27 LAB — HEMOGLOBIN A1C: HEMOGLOBIN A1C: 7.1 % — AB (ref 4.6–6.5)

## 2017-07-28 NOTE — Progress Notes (Signed)
Called pt and lmovm to return call.

## 2017-09-20 DIAGNOSIS — Z23 Encounter for immunization: Secondary | ICD-10-CM | POA: Diagnosis not present

## 2017-11-05 ENCOUNTER — Other Ambulatory Visit: Payer: Self-pay | Admitting: Family Medicine

## 2017-11-16 ENCOUNTER — Other Ambulatory Visit: Payer: Self-pay | Admitting: Family Medicine

## 2017-11-16 DIAGNOSIS — Z1231 Encounter for screening mammogram for malignant neoplasm of breast: Secondary | ICD-10-CM

## 2017-11-29 ENCOUNTER — Ambulatory Visit
Admission: RE | Admit: 2017-11-29 | Discharge: 2017-11-29 | Disposition: A | Discharge status: Home / Self Care | Payer: 59 | Source: Ambulatory Visit | Attending: Family Medicine | Admitting: Family Medicine

## 2017-11-29 DIAGNOSIS — Z1231 Encounter for screening mammogram for malignant neoplasm of breast: Secondary | ICD-10-CM | POA: Diagnosis not present

## 2017-11-30 ENCOUNTER — Encounter: Payer: Self-pay | Admitting: General Practice

## 2017-12-02 ENCOUNTER — Other Ambulatory Visit: Payer: Self-pay | Admitting: Family Medicine

## 2017-12-02 NOTE — Telephone Encounter (Signed)
Copied from [place] [id number]. Topic: Quick Communication - Rx Refill/Question >> Dec 02, 2017  8:50 AM Yvette Rack wrote: Has the patient contacted their pharmacy? Yes.  Its been two weeks since pharmacy sent over fax for this medicine   (Agent: If no, request that the patient contact the pharmacy for the refill.) amLODipine (NORVASC) 10 MG tablet pt is completely out of this medicine   Preferred Pharmacy (with phone number or street name): [id number] - [place], [place] - [place] [phone] (Phone) [phone] (Fax)     Agent: Please be advised that RX refills may take up to 3 business days. We ask that you follow-up with your pharmacy.

## 2017-12-02 NOTE — Telephone Encounter (Signed)
Refill request / Per Epic: NORVASC 10 MG tablet 30 tablet 4 12/02/2017    Sig: TAKE 1 TABLET BY MOUTH EVERY DAY   Sent to pharmacy as: [place] 10 MG tablet   E-Prescribing Status: Receipt confirmed by pharmacy (12/02/2017 8:59 AM EST)   Called patient and left a voicemail that advised her to call the pharmacy /

## 2017-12-23 ENCOUNTER — Other Ambulatory Visit: Payer: Self-pay | Admitting: Family Medicine

## 2018-01-19 ENCOUNTER — Other Ambulatory Visit: Payer: Self-pay | Admitting: Family Medicine

## 2018-01-30 ENCOUNTER — Encounter: Payer: Self-pay | Admitting: General Practice

## 2018-01-31 ENCOUNTER — Encounter: Payer: Self-pay | Admitting: Family Medicine

## 2018-01-31 ENCOUNTER — Other Ambulatory Visit: Payer: Self-pay

## 2018-01-31 ENCOUNTER — Ambulatory Visit (INDEPENDENT_AMBULATORY_CARE_PROVIDER_SITE_OTHER): Payer: 59 | Admitting: Family Medicine

## 2018-01-31 ENCOUNTER — Encounter: Payer: Self-pay | Admitting: General Practice

## 2018-01-31 VITALS — BP 126/86 | HR 55 | Temp 98.1°F | Resp 16 | Ht 59.0 in | Wt 138.4 lb

## 2018-01-31 DIAGNOSIS — Z0000 Encounter for general adult medical examination without abnormal findings: Secondary | ICD-10-CM | POA: Diagnosis not present

## 2018-01-31 DIAGNOSIS — Z1159 Encounter for screening for other viral diseases: Secondary | ICD-10-CM | POA: Diagnosis not present

## 2018-01-31 DIAGNOSIS — E119 Type 2 diabetes mellitus without complications: Secondary | ICD-10-CM | POA: Insufficient documentation

## 2018-01-31 DIAGNOSIS — Z9189 Other specified personal risk factors, not elsewhere classified: Secondary | ICD-10-CM | POA: Diagnosis not present

## 2018-01-31 LAB — BASIC METABOLIC PANEL
BUN: 14 mg/dL (ref 6–23)
CHLORIDE: 101 meq/L (ref 96–112)
CO2: 33 mEq/L — ABNORMAL HIGH (ref 19–32)
Calcium: 9.9 mg/dL (ref 8.4–10.5)
Creatinine, Ser: 0.68 mg/dL (ref 0.40–1.20)
GFR: 93.07 mL/min (ref >60.00)
Glucose, Bld: 106 mg/dL — ABNORMAL HIGH (ref 70–99)
POTASSIUM: 3.7 meq/L (ref 3.5–5.1)
Sodium: 140 mEq/L (ref 135–145)

## 2018-01-31 LAB — CBC WITH DIFFERENTIAL/PLATELET
BASOS PCT: 0.6 % (ref 0.0–3.0)
Basophils Absolute: 0 10*3/uL (ref 0.0–0.1)
EOS PCT: 1.4 % (ref 0.0–5.0)
Eosinophils Absolute: 0.1 10*3/uL (ref 0.0–0.7)
HCT: 40.3 % (ref 36.0–46.0)
HEMOGLOBIN: 12.7 g/dL (ref 12.0–15.0)
LYMPHS ABS: 1.3 10*3/uL (ref 0.7–4.0)
Lymphocytes Relative: 19.6 % (ref 12.0–46.0)
MCHC: 31.5 g/dL (ref 30.0–36.0)
MCV: 65.3 fl — ABNORMAL LOW (ref 78.0–100.0)
MONO ABS: 0.5 10*3/uL (ref 0.1–1.0)
Monocytes Relative: 7.9 % (ref 3.0–12.0)
NEUTROS ABS: 4.6 10*3/uL (ref 1.4–7.7)
Neutrophils Relative %: 70.5 % (ref 43.0–77.0)
Platelets: 247 10*3/uL (ref 150.0–400.0)
RBC: 6.17 Mil/uL — ABNORMAL HIGH (ref 3.87–5.11)
RDW: 15.7 % — ABNORMAL HIGH (ref 11.5–15.5)
WBC: 6.5 10*3/uL (ref 4.0–10.5)

## 2018-01-31 LAB — HEPATIC FUNCTION PANEL
ALK PHOS: 69 U/L (ref 39–117)
ALT: 21 U/L (ref 0–35)
AST: 23 U/L (ref 0–37)
Albumin: 4.5 g/dL (ref 3.5–5.2)
BILIRUBIN TOTAL: 0.6 mg/dL (ref 0.2–1.2)
Bilirubin, Direct: 0.1 mg/dL (ref 0.0–0.3)
Total Protein: 7.1 g/dL (ref 6.0–8.3)

## 2018-01-31 LAB — LIPID PANEL
CHOL/HDL RATIO: 3
Cholesterol: 171 mg/dL (ref 0–200)
HDL: 66.5 mg/dL (ref >39.00)
LDL Cholesterol: 67 mg/dL (ref 0–99)
NONHDL: 104.42
Triglycerides: 189 mg/dL — ABNORMAL HIGH (ref 0.0–149.0)
VLDL: 37.8 mg/dL (ref 0.0–40.0)

## 2018-01-31 LAB — HEMOGLOBIN A1C: HEMOGLOBIN A1C: 6.5 % (ref 4.6–6.5)

## 2018-01-31 LAB — MICROALBUMIN / CREATININE URINE RATIO
CREATININE, U: 122.5 mg/dL
MICROALB/CREAT RATIO: 0.9 mg/g (ref 0.0–30.0)
Microalb, Ur: 1.1 mg/dL (ref 0.0–1.9)

## 2018-01-31 LAB — TSH: TSH: 0.66 u[IU]/mL (ref 0.35–4.50)

## 2018-01-31 NOTE — Progress Notes (Signed)
   Subjective:    Patient ID: Abigail Ellis, female    DOB: 31-Mar-1956, 62 y.o.   MRN: [id number]  HPI CPE- UTD on pap, mammo, colonoscopy.  Pt has lost 9 lbs since last visit.  She has been watching her carb intake and has been checking sugars daily since last visit- running 90s-110s.  UTD on eye exam- done in June 2018.   Review of Systems Patient reports no vision/ hearing changes, adenopathy,fever, weight change,  persistant/recurrent hoarseness , swallowing issues, chest pain, palpitations, edema, persistant/recurrent cough, hemoptysis, dyspnea (rest/exertional/paroxysmal nocturnal), gastrointestinal bleeding (melena, rectal bleeding), abdominal pain, significant heartburn, bowel changes, GU symptoms (dysuria, hematuria, incontinence), Gyn symptoms (abnormal  bleeding, pain),  syncope, focal weakness, memory loss, numbness & tingling, skin/hair/nail changes, abnormal bruising or bleeding, anxiety, or depression.     Objective:   Physical Exam General Appearance:    Alert, cooperative, no distress, appears stated age  Head:    Normocephalic, without obvious abnormality, atraumatic  Eyes:    PERRL, conjunctiva/corneas clear, EOM's intact, fundi    benign, both eyes  Ears:    Normal TM's and external ear canals, both ears  Nose:   Nares normal, septum midline, mucosa normal, no drainage    or sinus tenderness  Throat:   Lips, mucosa, and tongue normal; teeth and gums normal  Neck:   Supple, symmetrical, trachea midline, no adenopathy;    Thyroid: no enlargement/tenderness/nodules  Back:     Symmetric, no curvature, ROM normal, no CVA tenderness  Lungs:     Clear to auscultation bilaterally, respirations unlabored  Chest Wall:    No tenderness or deformity   Heart:    Regular rate and rhythm, S1 and S2 normal, no murmur, rub   or gallop  Breast Exam:    Deferred to mammo  Abdomen:     Soft, non-tender, bowel sounds active all four quadrants,    no masses, no organomegaly  Genitalia:     Deferred  Rectal:    Extremities:   Extremities normal, atraumatic, no cyanosis or edema  Pulses:   2+ and symmetric all extremities  Skin:   Skin color, texture, turgor normal, no rashes or lesions  Lymph nodes:   Cervical, supraclavicular, and axillary nodes normal  Neurologic:   CNII-XII intact, normal strength, sensation and reflexes    throughout          Assessment & Plan:

## 2018-01-31 NOTE — Assessment & Plan Note (Signed)
New at last visit.  Reviewed pt's home CBGs- they are excellent.  UTD on eye exam.  Foot exam done today.  Will get microalbumin.  Applauded efforts at diet and exercise.  Check labs.  Will follow.

## 2018-01-31 NOTE — Assessment & Plan Note (Signed)
Pt's PE WNL.  UTD on mammo, colonoscopy, pap, and immunizations.  Check labs.  Anticipatory guidance provided.

## 2018-01-31 NOTE — Patient Instructions (Signed)
Follow up in 6 months to recheck sugar and cholesterol We'll notify you of your lab results and make any changes if needed Keep up the good work on healthy diet and regular exercise- you look great!!! Please have your eye doctor send me their report when you go back in June Call with any questions or concerns Happy Spring!!!

## 2018-02-01 ENCOUNTER — Encounter: Payer: Self-pay | Admitting: General Practice

## 2018-02-01 LAB — HEPATITIS C ANTIBODY
Hepatitis C Ab: NONREACTIVE
SIGNAL TO CUT-OFF: 0.33 (ref <1.00)

## 2018-03-26 DIAGNOSIS — H60392 Other infective otitis externa, left ear: Secondary | ICD-10-CM | POA: Diagnosis not present

## 2018-04-19 ENCOUNTER — Encounter: Payer: Self-pay | Admitting: Physician Assistant

## 2018-04-19 ENCOUNTER — Other Ambulatory Visit: Payer: Self-pay

## 2018-04-19 ENCOUNTER — Ambulatory Visit: Payer: 59 | Admitting: Physician Assistant

## 2018-04-19 VITALS — BP 108/72 | HR 60 | Temp 98.4°F | Resp 14 | Ht 59.0 in | Wt 144.0 lb

## 2018-04-19 DIAGNOSIS — R300 Dysuria: Secondary | ICD-10-CM

## 2018-04-19 LAB — POCT URINALYSIS DIPSTICK
Bilirubin, UA: NEGATIVE
Blood, UA: NEGATIVE
Glucose, UA: NEGATIVE
Ketones, UA: NEGATIVE
NITRITE UA: NEGATIVE
PROTEIN UA: NEGATIVE
Spec Grav, UA: 1.01 (ref 1.010–1.025)
Urobilinogen, UA: 0.2 E.U./dL
pH, UA: 6 (ref 5.0–8.0)

## 2018-04-19 MED ORDER — CEPHALEXIN 500 MG PO CAPS: 500.0000 mg | ORAL_CAPSULE | Freq: Two times a day (BID) | ORAL | 0 refills | Status: AC

## 2018-04-19 NOTE — Progress Notes (Signed)
Patient presents to clinic today c/o 3 days of suprapubic pressure/pain with dysuria x 1 day. Also notes low back pain. Denies fever, chills. Denies flank pain or hematuria. Patient does have history of constipation but denies change in this recently. Denies hematochezia/melena.  Past Medical History:  Diagnosis Date  . Anemia   . Diabetes mellitus without complication (HCC)    diet controlled  . Hyperlipidemia   . Hypertension   . Migraines     Current Outpatient Medications on File Prior to Visit  Medication Sig Dispense Refill  . aspirin 81 MG tablet Take 81 mg by mouth daily.    Marland Kitchen BIOTIN PO Take by mouth daily.      . COLLAGEN PO Take by mouth.    . ezetimibe (ZETIA) 10 MG tablet TAKE 1 TABLET BY MOUTH DAILY 30 tablet 6  . hydrochlorothiazide (MICROZIDE) 12.5 MG capsule TAKE ONE CAPSULE BY MOUTH ONCE DAILY 30 capsule 5  . NORVASC 10 MG tablet TAKE 1 TABLET BY MOUTH EVERY DAY 30 tablet 4  . rosuvastatin (CRESTOR) 10 MG tablet Take 1 tablet (10 mg total) by mouth daily. 30 tablet 6  . TOPROL XL 50 MG 24 hr tablet TAKE 1 TABLET BY MOUTH DAILY. TAKE WITH OR IMMEDIATELY FOLLOWING A MEAL. 30 tablet 6  . vitamin C (ASCORBIC ACID) 500 MG tablet Take 500 mg by mouth daily.     No current facility-administered medications on file prior to visit.     Allergies  Allergen Reactions  . Zocor [Simvastatin] Other (See Comments)    Severe muscle pain and joint pain   . Fenofibrate     "stomach pains"   . Potassium-Containing Compounds     "constipation"    Family History  Problem Relation Age of Onset  . Hypertension Mother        Mitral valve prolapse  . Heart disease Mother   . Hypertension Father   . Cancer Maternal Uncle        colon cancer  . Cancer Maternal Aunt        bladder  . Heart disease Brother        CAD; PCI at 31    Social History   Socioeconomic History  . Marital status: Married    Spouse name: Not on file  . Number of children: 3  . Years of  education: Not on file  . Highest education level: Not on file  Occupational History    Comment: RN  Social Needs  . Financial resource strain: Not on file  . Food insecurity:    Worry: Not on file    Inability: Not on file  . Transportation needs:    Medical: Not on file    Non-medical: Not on file  Tobacco Use  . Smoking status: Former Smoker    Packs/day: 0.25    Years: 10.00    Pack years: 2.50    Types: Cigarettes    Start date: 11/29/2014  . Smokeless tobacco: Never Used  Substance and Sexual Activity  . Alcohol use: No    Alcohol/week: 0.0 oz  . Drug use: No  . Sexual activity: Not Currently  Lifestyle  . Physical activity:    Days per week: Not on file    Minutes per session: Not on file  . Stress: Not on file  Relationships  . Social connections:    Talks on phone: Not on file    Gets together: Not on file    Attends  religious service: Not on file    Active member of club or organization: Not on file    Attends meetings of clubs or organizations: Not on file    Relationship status: Not on file  Other Topics Concern  . Not on file  Social History Narrative  . Not on file   Review of Systems - See HPI.  All other ROS are negative.  BP 108/72   Pulse 60   Temp 98.4 F (36.9 C) (Oral)   Resp 14   Ht 4\' 11"  (1.499 m)   Wt 144 lb (65.3 kg)   SpO2 98%   BMI 29.08 kg/m   Physical Exam  Constitutional: She appears well-developed and well-nourished.  HENT:  Head: Normocephalic and atraumatic.  Cardiovascular: Normal rate, regular rhythm, normal heart sounds and intact distal pulses.  Pulmonary/Chest: Effort normal.  Abdominal: Soft. There is no CVA tenderness.  Vitals reviewed.   Recent Results (from the past 2160 hour(s))  Urine Microalbumin w/creat. ratio     Status: None   Collection Time: 01/31/18  9:31 AM  Result Value Ref Range   Microalb, Ur 1.1 0.0 - 1.9 mg/dL   Creatinine,U 122.5 mg/dL   Microalb Creat Ratio 0.9 0.0 - 30.0 mg/g    Hemoglobin A1c     Status: None   Collection Time: 01/31/18  9:31 AM  Result Value Ref Range   Hgb A1c MFr Bld 6.5 4.6 - 6.5 %    Comment: Glycemic Control Guidelines for People with Diabetes:Non Diabetic:  <6%Goal of Therapy: <7%Additional Action Suggested:  >8%   Lipid panel     Status: Abnormal   Collection Time: 01/31/18  9:31 AM  Result Value Ref Range   Cholesterol 171 0 - 200 mg/dL    Comment: ATP III Classification       Desirable:  < 200 mg/dL               Borderline High:  200 - 239 mg/dL          High:  > = 240 mg/dL   Triglycerides 189.0 (H) 0.0 - 149.0 mg/dL    Comment: Normal:  <150 mg/dLBorderline High:  150 - 199 mg/dL   HDL 66.50 >39.00 mg/dL   VLDL 37.8 0.0 - 40.0 mg/dL   LDL Cholesterol 67 0 - 99 mg/dL   Total CHOL/HDL Ratio 3     Comment:                Men          Women1/2 Average Risk     3.4          3.3Average Risk          5.0          4.42X Average Risk          9.6          7.13X Average Risk          15.0          11.0                       NonHDL 104.42     Comment: NOTE:  Non-HDL goal should be 30 mg/dL higher than patient's LDL goal (i.e. LDL goal of < 70 mg/dL, would have non-HDL goal of < 100 mg/dL)  Basic metabolic panel     Status: Abnormal   Collection Time: 01/31/18  9:31 AM  Result Value Ref Range  Sodium 140 135 - 145 mEq/L   Potassium 3.7 3.5 - 5.1 mEq/L   Chloride 101 96 - 112 mEq/L   CO2 33 (H) 19 - 32 mEq/L   Glucose, Bld 106 (H) 70 - 99 mg/dL   BUN 14 6 - 23 mg/dL   Creatinine, Ser 0.68 0.40 - 1.20 mg/dL   Calcium 9.9 8.4 - 10.5 mg/dL   GFR 93.07 >60.00 mL/min  TSH     Status: None   Collection Time: 01/31/18  9:31 AM  Result Value Ref Range   TSH 0.66 0.35 - 4.50 uIU/mL  Hepatic function panel     Status: None   Collection Time: 01/31/18  9:31 AM  Result Value Ref Range   Total Bilirubin 0.6 0.2 - 1.2 mg/dL   Bilirubin, Direct 0.1 0.0 - 0.3 mg/dL   Alkaline Phosphatase 69 39 - 117 U/L   AST 23 0 - 37 U/L   ALT 21 0 - 35 U/L    Total Protein 7.1 6.0 - 8.3 g/dL   Albumin 4.5 3.5 - 5.2 g/dL  CBC with Differential/Platelet     Status: Abnormal   Collection Time: 01/31/18  9:31 AM  Result Value Ref Range   WBC 6.5 4.0 - 10.5 K/uL   RBC 6.17 (H) 3.87 - 5.11 Mil/uL   Hemoglobin 12.7 12.0 - 15.0 g/dL   HCT 40.3 36.0 - 46.0 %   MCV 65.3 Repeated and verified X2. (L) 78.0 - 100.0 fl   MCHC 31.5 30.0 - 36.0 g/dL   RDW 15.7 (H) 11.5 - 15.5 %   Platelets 247.0 150.0 - 400.0 K/uL   Neutrophils Relative % 70.5 43.0 - 77.0 %   Lymphocytes Relative 19.6 12.0 - 46.0 %   Monocytes Relative 7.9 3.0 - 12.0 %   Eosinophils Relative 1.4 0.0 - 5.0 %   Basophils Relative 0.6 0.0 - 3.0 %   Neutro Abs 4.6 1.4 - 7.7 K/uL   Lymphs Abs 1.3 0.7 - 4.0 K/uL   Monocytes Absolute 0.5 0.1 - 1.0 K/uL   Eosinophils Absolute 0.1 0.0 - 0.7 K/uL   Basophils Absolute 0.0 0.0 - 0.1 K/uL  Hepatitis C Antibody     Status: None   Collection Time: 01/31/18  9:31 AM  Result Value Ref Range   Hepatitis C Ab NON-REACTIVE NON-REACTI   SIGNAL TO CUT-OFF 0.33 <1.00    Comment: . HCV antibody was non-reactive. There is no laboratory  evidence of HCV infection. . In most cases, no further action is required. However, if recent HCV exposure is suspected, a test for HCV RNA (test code 406 [id number]) is suggested. . For additional information please refer to http://education.[facility].com/faq/FAQ22v1 (This link is being provided for informational/ educational purposes only.) .     Assessment/Plan: 1. Dysuria Urine dip with + LE. Classic symptoms of dysuria, suprapubic pressure and low back pain. Denies vaginal symptoms. Will start Keflex. Culture sent for further assessment. Supportive measures and OTC medications reviewed. Strict return precautions discussed with patient.   - POCT Urinalysis Dipstick - Urine Culture - cephALEXin (KEFLEX) 500 MG capsule; Take 1 capsule (500 mg total) by mouth 2 (two) times daily for 7 days.  Dispense: 14  capsule; Refill: 0   Leeanne Rio, PA-C

## 2018-04-19 NOTE — Patient Instructions (Signed)
Your symptoms are consistent with a bladder infection, also called acute cystitis. Please take your antibiotic (Keflex) as directed until all pills are gone.  Stay very well hydrated.  Consider a daily probiotic (Align, Culturelle, or Activia) to help prevent stomach upset caused by the antibiotic.  Taking a probiotic daily may also help prevent recurrent UTIs.  Also consider taking AZO (Phenazopyridine) tablets to help decrease pain with urination.  I will call you with your urine testing results.  We will change antibiotics if indicated.  Call or return to clinic if symptoms are not resolved by completion of antibiotic.   Urinary Tract Infection A urinary tract infection (UTI) can occur any place along the urinary tract. The tract includes the kidneys, ureters, bladder, and urethra. A type of germ called bacteria often causes a UTI. UTIs are often helped with antibiotic medicine.  HOME CARE   If given, take antibiotics as told by your doctor. Finish them even if you start to feel better.  Drink enough fluids to keep your pee (urine) clear or pale yellow.  Avoid tea, drinks with caffeine, and bubbly (carbonated) drinks.  Pee often. Avoid holding your pee in for a long time.  Pee before and after having sex (intercourse).  Wipe from front to back after you poop (bowel movement) if you are a woman. Use each tissue only once. GET HELP RIGHT AWAY IF:   You have back pain.  You have lower belly (abdominal) pain.  You have chills.  You feel sick to your stomach (nauseous).  You throw up (vomit).  Your burning or discomfort with peeing does not go away.  You have a fever.  Your symptoms are not better in 3 days. MAKE SURE YOU:   Understand these instructions.  Will watch your condition.  Will get help right away if you are not doing well or get worse. Document Released: 05/17/2008 Document Revised: 08/23/2012 Document Reviewed: 06/29/2012 [employer] Patient Information 2015  [employer]. This information is not intended to replace advice given to you by your health care provider. Make sure you discuss any questions you have with your health care provider.   

## 2018-04-20 LAB — URINE CULTURE
MICRO NUMBER: 90563615
RESULT: NO GROWTH
SPECIMEN QUALITY:: ADEQUATE

## 2018-04-21 NOTE — Progress Notes (Signed)
Please call patient: urine culture did not show a urinary tract infection. She may stop her antibiotics, drink plenty of water, and let us know if her symptoms do not resolve

## 2018-06-02 ENCOUNTER — Other Ambulatory Visit: Payer: Self-pay | Admitting: Family Medicine

## 2018-06-04 ENCOUNTER — Other Ambulatory Visit: Payer: Self-pay | Admitting: Family Medicine

## 2018-06-30 ENCOUNTER — Other Ambulatory Visit: Payer: Self-pay | Admitting: Family Medicine

## 2018-08-01 ENCOUNTER — Other Ambulatory Visit: Payer: Self-pay

## 2018-08-01 ENCOUNTER — Ambulatory Visit: Payer: 59 | Admitting: Family Medicine

## 2018-08-01 ENCOUNTER — Encounter: Payer: Self-pay | Admitting: Family Medicine

## 2018-08-01 VITALS — BP 116/70 | HR 66 | Temp 98.0°F | Resp 15 | Ht 59.0 in | Wt 142.0 lb

## 2018-08-01 DIAGNOSIS — E785 Hyperlipidemia, unspecified: Secondary | ICD-10-CM

## 2018-08-01 DIAGNOSIS — I10 Essential (primary) hypertension: Secondary | ICD-10-CM | POA: Diagnosis not present

## 2018-08-01 DIAGNOSIS — E119 Type 2 diabetes mellitus without complications: Secondary | ICD-10-CM | POA: Diagnosis not present

## 2018-08-01 LAB — CBC WITH DIFFERENTIAL/PLATELET
BASOS ABS: 0 10*3/uL (ref 0.0–0.1)
Basophils Relative: 0.5 % (ref 0.0–3.0)
Eosinophils Absolute: 0.1 10*3/uL (ref 0.0–0.7)
Eosinophils Relative: 1.2 % (ref 0.0–5.0)
HCT: 39.4 % (ref 36.0–46.0)
Hemoglobin: 12.3 g/dL (ref 12.0–15.0)
Lymphocytes Relative: 22.1 % (ref 12.0–46.0)
Lymphs Abs: 1.1 10*3/uL (ref 0.7–4.0)
MCHC: 31.3 g/dL (ref 30.0–36.0)
MONOS PCT: 9.5 % (ref 3.0–12.0)
Monocytes Absolute: 0.5 10*3/uL (ref 0.1–1.0)
Neutro Abs: 3.4 10*3/uL (ref 1.4–7.7)
Neutrophils Relative %: 66.7 % (ref 43.0–77.0)
Platelets: 220 10*3/uL (ref 150.0–400.0)
RBC: 6.07 Mil/uL — AB (ref 3.87–5.11)
RDW: 16 % — ABNORMAL HIGH (ref 11.5–15.5)
WBC: 5.1 10*3/uL (ref 4.0–10.5)

## 2018-08-01 LAB — HEPATIC FUNCTION PANEL
ALT: 24 U/L (ref 0–35)
AST: 21 U/L (ref 0–37)
Albumin: 4.6 g/dL (ref 3.5–5.2)
Alkaline Phosphatase: 75 U/L (ref 39–117)
BILIRUBIN DIRECT: 0.1 mg/dL (ref 0.0–0.3)
BILIRUBIN TOTAL: 0.6 mg/dL (ref 0.2–1.2)
Total Protein: 7.2 g/dL (ref 6.0–8.3)

## 2018-08-01 LAB — TSH: TSH: 0.59 u[IU]/mL (ref 0.35–4.50)

## 2018-08-01 LAB — LIPID PANEL
Cholesterol: 154 mg/dL (ref 0–200)
HDL: 64.9 mg/dL (ref >39.00)
LDL Cholesterol: 60 mg/dL (ref 0–99)
NONHDL: 88.81
Total CHOL/HDL Ratio: 2
Triglycerides: 145 mg/dL (ref 0.0–149.0)
VLDL: 29 mg/dL (ref 0.0–40.0)

## 2018-08-01 LAB — HEMOGLOBIN A1C: HEMOGLOBIN A1C: 6.4 % (ref 4.6–6.5)

## 2018-08-01 LAB — BASIC METABOLIC PANEL
BUN: 18 mg/dL (ref 6–23)
CALCIUM: 9.9 mg/dL (ref 8.4–10.5)
CHLORIDE: 101 meq/L (ref 96–112)
CO2: 31 meq/L (ref 19–32)
Creatinine, Ser: 0.74 mg/dL (ref 0.40–1.20)
GFR: 84.29 mL/min (ref >60.00)
Glucose, Bld: 112 mg/dL — ABNORMAL HIGH (ref 70–99)
POTASSIUM: 3.8 meq/L (ref 3.5–5.1)
SODIUM: 140 meq/L (ref 135–145)

## 2018-08-01 NOTE — Patient Instructions (Signed)
Schedule your complete physical in 6 months We'll notify you of your lab results and make any changes if needed Keep up the good work on healthy diet and regular exercise- you look great! Call and check with your insurance company about the Shingrix shot Call with any questions or concerns Enjoy the last of your summer!!!

## 2018-08-01 NOTE — Progress Notes (Signed)
   Subjective:    Patient ID: Abigail Ellis, female    DOB: June 21, 1956, 62 y.o.   MRN: [id number]  HPI HTN- chronic problem, well controlled.  On HCTZ 12.5mg , Amlodipine 10mg , and Metoprolol XL 50mg - well controlled.  No CP, SOB, HAs, visual changes, edema.  Hyperlipidemia- chronic problem, on Zetia 10mg  daily and Crestor 10mg  daily (alternating the 2 meds).  No abd pain, N/V  DM- chronic problem, diet controlled.  UTD on microalbumin, UTD on foot exam, UTD on eye exam.  Will walk on treadmill.  No numbness/tingling of hands/feet w/ exception of R carpal tunnel.   Review of Systems For ROS see HPI     Objective:   Physical Exam  Constitutional: She is oriented to person, place, and time. She appears well-developed and well-nourished. No distress.  HENT:  Head: Normocephalic and atraumatic.  Eyes: Pupils are equal, round, and reactive to light. Conjunctivae and EOM are normal.  Neck: Normal range of motion. Neck supple. No thyromegaly present.  Cardiovascular: Normal rate, regular rhythm, normal heart sounds and intact distal pulses.  No murmur heard. Pulmonary/Chest: Effort normal and breath sounds normal. No respiratory distress.  Abdominal: Soft. She exhibits no distension. There is no tenderness.  Musculoskeletal: She exhibits no edema.  Lymphadenopathy:    She has no cervical adenopathy.  Neurological: She is alert and oriented to person, place, and time.  Skin: Skin is warm and dry.  Psychiatric: She has a normal mood and affect. Her behavior is normal.  Vitals reviewed.         Assessment & Plan:

## 2018-08-01 NOTE — Assessment & Plan Note (Signed)
Chronic problem.  Well controlled.  Asymptomatic.  Check labs.  No anticipated med changes.  Will follow. 

## 2018-08-01 NOTE — Assessment & Plan Note (Signed)
Chronic problem.  Alternating Crestor and Zetia w/ recent good control.  Applauded her efforts at diet and exercise.  Check labs.  Adjust meds prn

## 2018-08-01 NOTE — Assessment & Plan Note (Signed)
Chronic problem.  UTD on foot exam, eye exam, microalbumin.  Check labs and adjust tx prn.

## 2018-09-01 ENCOUNTER — Other Ambulatory Visit: Payer: Self-pay | Admitting: Family Medicine

## 2018-09-05 DIAGNOSIS — Z23 Encounter for immunization: Secondary | ICD-10-CM | POA: Diagnosis not present

## 2018-09-17 DIAGNOSIS — Z23 Encounter for immunization: Secondary | ICD-10-CM | POA: Diagnosis not present

## 2018-10-18 ENCOUNTER — Other Ambulatory Visit: Payer: Self-pay | Admitting: Family Medicine

## 2018-10-18 DIAGNOSIS — Z1231 Encounter for screening mammogram for malignant neoplasm of breast: Secondary | ICD-10-CM

## 2018-11-01 DIAGNOSIS — Z23 Encounter for immunization: Secondary | ICD-10-CM | POA: Diagnosis not present

## 2018-11-14 DIAGNOSIS — Z23 Encounter for immunization: Secondary | ICD-10-CM | POA: Diagnosis not present

## 2018-12-04 ENCOUNTER — Ambulatory Visit
Admission: RE | Admit: 2018-12-04 | Discharge: 2018-12-04 | Disposition: A | Discharge status: Home / Self Care | Payer: 59 | Source: Ambulatory Visit | Attending: Family Medicine | Admitting: Family Medicine

## 2018-12-04 DIAGNOSIS — Z1231 Encounter for screening mammogram for malignant neoplasm of breast: Secondary | ICD-10-CM | POA: Diagnosis not present

## 2018-12-05 ENCOUNTER — Encounter: Payer: Self-pay | Admitting: General Practice

## 2018-12-26 ENCOUNTER — Other Ambulatory Visit: Payer: Self-pay | Admitting: Family Medicine

## 2019-01-30 ENCOUNTER — Other Ambulatory Visit: Payer: Self-pay

## 2019-01-30 ENCOUNTER — Ambulatory Visit: Payer: 59 | Admitting: Family Medicine

## 2019-01-30 ENCOUNTER — Encounter: Payer: Self-pay | Admitting: Family Medicine

## 2019-01-30 VITALS — BP 121/80 | HR 56 | Temp 98.1°F | Resp 16 | Ht 59.0 in | Wt 138.0 lb

## 2019-01-30 DIAGNOSIS — I10 Essential (primary) hypertension: Secondary | ICD-10-CM | POA: Diagnosis not present

## 2019-01-30 DIAGNOSIS — E119 Type 2 diabetes mellitus without complications: Secondary | ICD-10-CM

## 2019-01-30 DIAGNOSIS — E785 Hyperlipidemia, unspecified: Secondary | ICD-10-CM | POA: Diagnosis not present

## 2019-01-30 LAB — CBC WITH DIFFERENTIAL/PLATELET
Basophils Absolute: 0 10*3/uL (ref 0.0–0.1)
Basophils Relative: 0.7 % (ref 0.0–3.0)
Eosinophils Absolute: 0.1 10*3/uL (ref 0.0–0.7)
Eosinophils Relative: 1.2 % (ref 0.0–5.0)
HCT: 38.9 % (ref 36.0–46.0)
Hemoglobin: 12.2 g/dL (ref 12.0–15.0)
LYMPHS ABS: 1.3 10*3/uL (ref 0.7–4.0)
Lymphocytes Relative: 19.1 % (ref 12.0–46.0)
MCHC: 31.4 g/dL (ref 30.0–36.0)
MCV: 65.1 fl — ABNORMAL LOW (ref 78.0–100.0)
Monocytes Absolute: 0.5 10*3/uL (ref 0.1–1.0)
Monocytes Relative: 7 % (ref 3.0–12.0)
Neutro Abs: 4.8 10*3/uL (ref 1.4–7.7)
Neutrophils Relative %: 72 % (ref 43.0–77.0)
Platelets: 205 10*3/uL (ref 150.0–400.0)
RBC: 5.98 Mil/uL — ABNORMAL HIGH (ref 3.87–5.11)
RDW: 15.9 % — AB (ref 11.5–15.5)
WBC: 6.6 10*3/uL (ref 4.0–10.5)

## 2019-01-30 LAB — HEPATIC FUNCTION PANEL
ALK PHOS: 63 U/L (ref 39–117)
ALT: 19 U/L (ref 0–35)
AST: 29 U/L (ref 0–37)
Albumin: 4.6 g/dL (ref 3.5–5.2)
BILIRUBIN DIRECT: 0.1 mg/dL (ref 0.0–0.3)
TOTAL PROTEIN: 7 g/dL (ref 6.0–8.3)
Total Bilirubin: 0.4 mg/dL (ref 0.2–1.2)

## 2019-01-30 LAB — LIPID PANEL
Cholesterol: 153 mg/dL (ref 0–200)
HDL: 65.8 mg/dL (ref >39.00)
LDL Cholesterol: 66 mg/dL (ref 0–99)
NonHDL: 87
Total CHOL/HDL Ratio: 2
Triglycerides: 105 mg/dL (ref 0.0–149.0)
VLDL: 21 mg/dL (ref 0.0–40.0)

## 2019-01-30 LAB — BASIC METABOLIC PANEL
BUN: 30 mg/dL — ABNORMAL HIGH (ref 6–23)
CO2: 29 meq/L (ref 19–32)
Calcium: 9.7 mg/dL (ref 8.4–10.5)
Chloride: 100 mEq/L (ref 96–112)
Creatinine, Ser: 0.78 mg/dL (ref 0.40–1.20)
GFR: 74.51 mL/min (ref >60.00)
GLUCOSE: 97 mg/dL (ref 70–99)
POTASSIUM: 3.7 meq/L (ref 3.5–5.1)
SODIUM: 138 meq/L (ref 135–145)

## 2019-01-30 LAB — TSH: TSH: 0.51 u[IU]/mL (ref 0.35–4.50)

## 2019-01-30 LAB — MICROALBUMIN / CREATININE URINE RATIO
Creatinine,U: 28.9 mg/dL
Microalb Creat Ratio: 2.4 mg/g (ref 0.0–30.0)
Microalb, Ur: <0.7 mg/dL (ref 0.0–1.9)

## 2019-01-30 LAB — HEMOGLOBIN A1C: HEMOGLOBIN A1C: 6.3 % (ref 4.6–6.5)

## 2019-01-30 NOTE — Assessment & Plan Note (Signed)
Chronic problem, tolerating statin and Zetia w/o difficulty.  Check labs.  Adjust meds prn

## 2019-01-30 NOTE — Progress Notes (Signed)
   Subjective:    Patient ID: Abigail Ellis, female    DOB: July 25, 1956, 63 y.o.   MRN: [id number]  HPI HTN- chronic problem, on HCTZ 12.5mg  daily, Norvasc 10mg  daily, and Toprol 50mg  daily w/ good control.  Denies CP, SOB, HAs, visual changes, edema.  Hyperlipidemia- chronic problem, on Crestor 10mg  daily and Zetia 10mg  daily.  No abd pain, N/V.  DM- diet controlled, pt is down 4 lbs.  UTD on eye exam, due for foot exam.  Due for microalbumin.  No numbness/tingling of hands/feet.  Fasting CBGs 120-140s.  Review of Systems For ROS see HPI     Objective:   Physical Exam Vitals signs reviewed.  Constitutional:      General: She is not in acute distress.    Appearance: She is well-developed.  HENT:     Head: Normocephalic and atraumatic.  Eyes:     Conjunctiva/sclera: Conjunctivae normal.     Pupils: Pupils are equal, round, and reactive to light.  Neck:     Musculoskeletal: Normal range of motion and neck supple.     Thyroid: No thyromegaly.  Cardiovascular:     Rate and Rhythm: Normal rate and regular rhythm.     Heart sounds: Normal heart sounds. No murmur.  Pulmonary:     Effort: Pulmonary effort is normal. No respiratory distress.     Breath sounds: Normal breath sounds.  Abdominal:     General: There is no distension.     Palpations: Abdomen is soft.     Tenderness: There is no abdominal tenderness.  Lymphadenopathy:     Cervical: No cervical adenopathy.  Skin:    General: Skin is warm and dry.  Neurological:     Mental Status: She is alert and oriented to person, place, and time.  Psychiatric:        Behavior: Behavior normal.           Assessment & Plan:

## 2019-01-30 NOTE — Patient Instructions (Signed)
Schedule your complete physical in 6 months We'll notify you of your lab results and make any changes if needed Keep up the good work on healthy diet and regular exercise- you look great!! Schedule your eye exam! Call with any questions or concerns [place]!!

## 2019-01-30 NOTE — Assessment & Plan Note (Signed)
Chronic problem.  Well controlled today.  Asymptomatic.  Check labs.  No anticipated med changes.  Will follow. 

## 2019-01-30 NOTE — Assessment & Plan Note (Signed)
Chronic problem.  UTD on eye exam- will schedule for this spring.  Foot exam done today.  Microalbumin ordered.  Check labs and start meds if needed

## 2019-01-31 ENCOUNTER — Encounter: Payer: Self-pay | Admitting: General Practice

## 2019-03-16 DIAGNOSIS — M25511 Pain in right shoulder: Secondary | ICD-10-CM | POA: Diagnosis not present

## 2019-03-26 ENCOUNTER — Other Ambulatory Visit: Payer: Self-pay | Admitting: Family Medicine

## 2019-03-28 DIAGNOSIS — M25511 Pain in right shoulder: Secondary | ICD-10-CM | POA: Diagnosis not present

## 2019-04-11 ENCOUNTER — Other Ambulatory Visit: Payer: Self-pay | Admitting: Family Medicine

## 2019-04-23 ENCOUNTER — Other Ambulatory Visit: Payer: Self-pay | Admitting: General Practice

## 2019-04-23 MED ORDER — FLUTICASONE PROPIONATE 50 MCG/ACT NA SUSP
2.0000 | Freq: Every day | NASAL | 3 refills | Status: AC
Start: 2019-04-23 — End: 2020-04-22

## 2019-04-25 DIAGNOSIS — M25511 Pain in right shoulder: Secondary | ICD-10-CM | POA: Diagnosis not present

## 2019-05-09 DIAGNOSIS — Z23 Encounter for immunization: Secondary | ICD-10-CM | POA: Diagnosis not present

## 2019-06-24 ENCOUNTER — Other Ambulatory Visit: Payer: Self-pay | Admitting: Family Medicine

## 2019-06-29 ENCOUNTER — Other Ambulatory Visit: Payer: Self-pay | Admitting: Family Medicine

## 2019-08-01 ENCOUNTER — Ambulatory Visit: Payer: 59 | Admitting: Family Medicine

## 2019-08-07 ENCOUNTER — Encounter: Payer: 59 | Admitting: Family Medicine

## 2019-09-26 ENCOUNTER — Other Ambulatory Visit: Payer: Self-pay

## 2019-09-26 ENCOUNTER — Ambulatory Visit (INDEPENDENT_AMBULATORY_CARE_PROVIDER_SITE_OTHER): Payer: 59 | Admitting: Family Medicine

## 2019-09-26 ENCOUNTER — Encounter: Payer: Self-pay | Admitting: Family Medicine

## 2019-09-26 VITALS — BP 121/80 | HR 101 | Temp 97.9°F | Resp 16 | Ht 59.0 in | Wt 141.0 lb

## 2019-09-26 DIAGNOSIS — E119 Type 2 diabetes mellitus without complications: Secondary | ICD-10-CM

## 2019-09-26 DIAGNOSIS — Z0000 Encounter for general adult medical examination without abnormal findings: Secondary | ICD-10-CM

## 2019-09-26 LAB — HEPATIC FUNCTION PANEL
ALT: 22 U/L (ref 0–35)
AST: 32 U/L (ref 0–37)
Albumin: 4.6 g/dL (ref 3.5–5.2)
Alkaline Phosphatase: 72 U/L (ref 39–117)
Bilirubin, Direct: 0.1 mg/dL (ref 0.0–0.3)
Total Bilirubin: 0.6 mg/dL (ref 0.2–1.2)
Total Protein: 7 g/dL (ref 6.0–8.3)

## 2019-09-26 LAB — CBC WITH DIFFERENTIAL/PLATELET
Basophils Absolute: 0.1 10*3/uL (ref 0.0–0.1)
Basophils Relative: 1 % (ref 0.0–3.0)
Eosinophils Absolute: 0.1 10*3/uL (ref 0.0–0.7)
Eosinophils Relative: 0.9 % (ref 0.0–5.0)
HCT: 39.1 % (ref 36.0–46.0)
Hemoglobin: 12.3 g/dL (ref 12.0–15.0)
Lymphocytes Relative: 24.6 % (ref 12.0–46.0)
Lymphs Abs: 1.4 10*3/uL (ref 0.7–4.0)
MCHC: 31.5 g/dL (ref 30.0–36.0)
MCV: 66 fl — ABNORMAL LOW (ref 78.0–100.0)
Monocytes Absolute: 0.5 10*3/uL (ref 0.1–1.0)
Monocytes Relative: 7.9 % (ref 3.0–12.0)
Neutro Abs: 3.7 10*3/uL (ref 1.4–7.7)
Neutrophils Relative %: 65.6 % (ref 43.0–77.0)
Platelets: 221 10*3/uL (ref 150.0–400.0)
RBC: 5.92 Mil/uL — ABNORMAL HIGH (ref 3.87–5.11)
RDW: 15.8 % — ABNORMAL HIGH (ref 11.5–15.5)
WBC: 5.7 10*3/uL (ref 4.0–10.5)

## 2019-09-26 LAB — BASIC METABOLIC PANEL
BUN: 17 mg/dL (ref 6–23)
CO2: 30 mEq/L (ref 19–32)
Calcium: 9.7 mg/dL (ref 8.4–10.5)
Chloride: 101 mEq/L (ref 96–112)
Creatinine, Ser: 0.68 mg/dL (ref 0.40–1.20)
GFR: 87.11 mL/min (ref >60.00)
Glucose, Bld: 102 mg/dL — ABNORMAL HIGH (ref 70–99)
Potassium: 3.7 mEq/L (ref 3.5–5.1)
Sodium: 139 mEq/L (ref 135–145)

## 2019-09-26 LAB — LIPID PANEL
Cholesterol: 169 mg/dL (ref 0–200)
HDL: 64.4 mg/dL (ref >39.00)
LDL Cholesterol: 77 mg/dL (ref 0–99)
NonHDL: 104.5
Total CHOL/HDL Ratio: 3
Triglycerides: 138 mg/dL (ref 0.0–149.0)
VLDL: 27.6 mg/dL (ref 0.0–40.0)

## 2019-09-26 LAB — TSH: TSH: 0.37 u[IU]/mL (ref 0.35–4.50)

## 2019-09-26 LAB — HEMOGLOBIN A1C: Hgb A1c MFr Bld: 6.5 % (ref 4.6–6.5)

## 2019-09-26 MED ORDER — AMLODIPINE BESYLATE 5 MG PO TABS: 5.0000 mg | ORAL_TABLET | Freq: Every day | ORAL | 1 refills | Status: DC

## 2019-09-26 NOTE — Patient Instructions (Signed)
Follow up in 6 months to recheck sugar, BP, and cholesterol (we'll do your pap at this time) We'll notify you of your lab results and make any changes if needed Continue to work on healthy diet and regular exercise- you can do it! Schedule your eye exam and have them send me the report Call with any questions or concerns Stay Safe!!!

## 2019-09-26 NOTE — Progress Notes (Signed)
   Subjective:    Patient ID: Abigail Ellis, female    DOB: 01-13-1956, 63 y.o.   MRN: [id number]  HPI CPE- UTD on mammo, colonoscopy.  Due for pap, eye exam.  UTD on flu, Tdap   Review of Systems Patient reports no vision/ hearing changes, adenopathy,fever, weight change,  persistant/recurrent hoarseness , swallowing issues, chest pain, palpitations, edema, persistant/recurrent cough, hemoptysis, dyspnea (rest/exertional/paroxysmal nocturnal), gastrointestinal bleeding (melena, rectal bleeding), abdominal pain, significant heartburn, bowel changes, GU symptoms (dysuria, hematuria, incontinence), Gyn symptoms (abnormal  bleeding, pain),  syncope, focal weakness, memory loss, numbness & tingling, skin/hair/nail changes, abnormal bruising or bleeding, anxiety, or depression.     Objective:   Physical Exam General Appearance:    Alert, cooperative, no distress, appears stated age  Head:    Normocephalic, without obvious abnormality, atraumatic  Eyes:    PERRL, conjunctiva/corneas clear, EOM's intact, fundi    benign, both eyes  Ears:    Normal TM's and external ear canals, both ears  Nose:   Deferred due to COVID  Throat:   Neck:   Supple, symmetrical, trachea midline, no adenopathy;    Thyroid: no enlargement/tenderness/nodules  Back:     Symmetric, no curvature, ROM normal, no CVA tenderness  Lungs:     Clear to auscultation bilaterally, respirations unlabored  Chest Wall:    No tenderness or deformity   Heart:    Regular rate and rhythm, S1 and S2 normal, no murmur, rub   or gallop  Breast Exam:    Deferred to mammo  Abdomen:     Soft, non-tender, bowel sounds active all four quadrants,    no masses, no organomegaly  Genitalia:    Deferred  Rectal:    Extremities:   Extremities normal, atraumatic, no cyanosis or edema  Pulses:   2+ and symmetric all extremities  Skin:   Skin color, texture, turgor normal, no rashes or lesions  Lymph nodes:   Cervical, supraclavicular, and axillary  nodes normal  Neurologic:   CNII-XII intact, normal strength, sensation and reflexes    throughout          Assessment & Plan:

## 2019-09-26 NOTE — Assessment & Plan Note (Signed)
Ongoing issue for pt.  Due for A1C.  Overdue for eye exam.  UTD on microalbumin, foot exam.  Check labs.  Adjust tx prn.

## 2019-09-26 NOTE — Assessment & Plan Note (Signed)
Pt's PE WNL w/ exception of being overweight.  Due for pap- pt would like to come back for this.  UTD on mammo, colonoscopy.  Check labs.  Anticipatory guidance provided.

## 2019-09-27 ENCOUNTER — Encounter: Payer: Self-pay | Admitting: General Practice

## 2019-10-26 ENCOUNTER — Other Ambulatory Visit: Payer: Self-pay | Admitting: Family Medicine

## 2019-11-05 ENCOUNTER — Other Ambulatory Visit: Payer: Self-pay | Admitting: Family Medicine

## 2019-11-05 DIAGNOSIS — Z1231 Encounter for screening mammogram for malignant neoplasm of breast: Secondary | ICD-10-CM

## 2019-12-18 ENCOUNTER — Other Ambulatory Visit: Payer: Self-pay

## 2019-12-18 MED ORDER — HYDROCHLOROTHIAZIDE 12.5 MG PO CAPS: ORAL_CAPSULE | ORAL | 0 refills | Status: DC

## 2019-12-18 MED ORDER — AMLODIPINE BESYLATE 5 MG PO TABS: 5.0000 mg | ORAL_TABLET | Freq: Every day | ORAL | 1 refills | Status: DC

## 2019-12-25 ENCOUNTER — Other Ambulatory Visit: Payer: Self-pay

## 2019-12-25 ENCOUNTER — Ambulatory Visit
Admission: RE | Admit: 2019-12-25 | Discharge: 2019-12-25 | Disposition: A | Discharge status: Home / Self Care | Payer: 59 | Source: Ambulatory Visit | Attending: Family Medicine | Admitting: Family Medicine

## 2019-12-25 DIAGNOSIS — Z1231 Encounter for screening mammogram for malignant neoplasm of breast: Secondary | ICD-10-CM

## 2019-12-28 LAB — HM DIABETES EYE EXAM

## 2020-02-23 ENCOUNTER — Other Ambulatory Visit: Payer: Self-pay | Admitting: Family Medicine

## 2020-03-27 ENCOUNTER — Ambulatory Visit: Payer: 59 | Admitting: Family Medicine

## 2020-03-27 ENCOUNTER — Encounter: Payer: Self-pay | Admitting: Family Medicine

## 2020-03-27 ENCOUNTER — Other Ambulatory Visit: Payer: Self-pay

## 2020-03-27 VITALS — BP 122/80 | HR 47 | Temp 97.8°F | Resp 16 | Ht 59.0 in | Wt 136.0 lb

## 2020-03-27 DIAGNOSIS — E119 Type 2 diabetes mellitus without complications: Secondary | ICD-10-CM | POA: Diagnosis not present

## 2020-03-27 DIAGNOSIS — E785 Hyperlipidemia, unspecified: Secondary | ICD-10-CM | POA: Diagnosis not present

## 2020-03-27 DIAGNOSIS — I10 Essential (primary) hypertension: Secondary | ICD-10-CM | POA: Diagnosis not present

## 2020-03-27 LAB — CBC WITH DIFFERENTIAL/PLATELET
Basophils Absolute: 0 10*3/uL (ref 0.0–0.1)
Basophils Relative: 0.8 % (ref 0.0–3.0)
Eosinophils Absolute: 0.1 10*3/uL (ref 0.0–0.7)
Eosinophils Relative: 1.5 % (ref 0.0–5.0)
HCT: 37.7 % (ref 36.0–46.0)
Hemoglobin: 12 g/dL (ref 12.0–15.0)
Lymphocytes Relative: 19 % (ref 12.0–46.0)
Lymphs Abs: 1.1 10*3/uL (ref 0.7–4.0)
MCHC: 31.9 g/dL (ref 30.0–36.0)
MCV: 65.5 fl — ABNORMAL LOW (ref 78.0–100.0)
Monocytes Absolute: 0.4 10*3/uL (ref 0.1–1.0)
Monocytes Relative: 7.3 % (ref 3.0–12.0)
Neutro Abs: 4.2 10*3/uL (ref 1.4–7.7)
Neutrophils Relative %: 71.4 % (ref 43.0–77.0)
Platelets: 221 10*3/uL (ref 150.0–400.0)
RBC: 5.76 Mil/uL — ABNORMAL HIGH (ref 3.87–5.11)
RDW: 16.3 % — ABNORMAL HIGH (ref 11.5–15.5)
WBC: 5.9 10*3/uL (ref 4.0–10.5)

## 2020-03-27 LAB — HEPATIC FUNCTION PANEL
ALT: 24 U/L (ref 0–35)
AST: 39 U/L — ABNORMAL HIGH (ref 0–37)
Albumin: 4.5 g/dL (ref 3.5–5.2)
Alkaline Phosphatase: 69 U/L (ref 39–117)
Bilirubin, Direct: 0.1 mg/dL (ref 0.0–0.3)
Total Bilirubin: 0.6 mg/dL (ref 0.2–1.2)
Total Protein: 6.7 g/dL (ref 6.0–8.3)

## 2020-03-27 LAB — LIPID PANEL
Cholesterol: 163 mg/dL (ref 0–200)
HDL: 71.5 mg/dL (ref >39.00)
LDL Cholesterol: 73 mg/dL (ref 0–99)
NonHDL: 91.12
Total CHOL/HDL Ratio: 2
Triglycerides: 90 mg/dL (ref 0.0–149.0)
VLDL: 18 mg/dL (ref 0.0–40.0)

## 2020-03-27 LAB — BASIC METABOLIC PANEL
BUN: 21 mg/dL (ref 6–23)
CO2: 31 mEq/L (ref 19–32)
Calcium: 9.7 mg/dL (ref 8.4–10.5)
Chloride: 100 mEq/L (ref 96–112)
Creatinine, Ser: 0.77 mg/dL (ref 0.40–1.20)
GFR: 75.35 mL/min (ref >60.00)
Glucose, Bld: 110 mg/dL — ABNORMAL HIGH (ref 70–99)
Potassium: 3.6 mEq/L (ref 3.5–5.1)
Sodium: 139 mEq/L (ref 135–145)

## 2020-03-27 LAB — HEMOGLOBIN A1C: Hgb A1c MFr Bld: 6.1 % (ref 4.6–6.5)

## 2020-03-27 LAB — TSH: TSH: 0.49 u[IU]/mL (ref 0.35–4.50)

## 2020-03-27 LAB — MICROALBUMIN / CREATININE URINE RATIO
Creatinine,U: 17 mg/dL
Microalb Creat Ratio: 4.1 mg/g (ref 0.0–30.0)
Microalb, Ur: <0.7 mg/dL (ref 0.0–1.9)

## 2020-03-27 NOTE — Assessment & Plan Note (Signed)
Chronic problem.  Hx of good control.  Asymptomatic.  UTD on eye exam.  Foot exam done today.  Microalbumin and A1C ordered.  Check labs and adjust tx prn.

## 2020-03-27 NOTE — Assessment & Plan Note (Signed)
Chronic problem.  Tolerating statin and Zetia w/o difficulty.  Check labs.  Adjust meds prn  

## 2020-03-27 NOTE — Assessment & Plan Note (Signed)
Chronic problem, well controlled.  Asymptomatic.  Check labs.  No anticipated med changes. °

## 2020-03-27 NOTE — Patient Instructions (Signed)
Schedule your complete physical in 6 months We'll notify you of your lab results and make any changes if needed Keep up the good work on healthy diet and regular exercise- you look great! Call with any questions or concerns Stay Safe!  Stay Healthy!!! 

## 2020-03-27 NOTE — Progress Notes (Signed)
   Subjective:    Patient ID: Abigail Ellis, female    DOB: 03/24/1956, 64 y.o.   MRN: [id number]  HPI HTN- chronic problem, on Amlodipine 5mg , HCTZ 12.5mg , and Toprol 50mg  daily w/ good control.  No CP, SOB, HAs, visual changes, edema.  Hyperlipidemia- chronic problem, on Crestor 10mg  daily and Zetia 10mg  daily.  No abd pain, N/V.  DM- chronic problem, has been able to control w/ diet and exercise.  She is down 5 lbs since last visit.  Watching diet.  UTD on eye exam.  Due for foot exam, micro, A1C.  No numbness/tingling of hands/feet.  No sores or blisters on feet.   Review of Systems For ROS see HPI   This visit occurred during the SARS-CoV-2 public health emergency.  Safety protocols were in place, including screening questions prior to the visit, additional usage of staff PPE, and extensive cleaning of exam room while observing appropriate contact time as indicated for disinfecting solutions.       Objective:   Physical Exam Vitals reviewed.  Constitutional:      General: She is not in acute distress.    Appearance: Normal appearance. She is well-developed.  HENT:     Head: Normocephalic and atraumatic.  Eyes:     Conjunctiva/sclera: Conjunctivae normal.     Pupils: Pupils are equal, round, and reactive to light.  Neck:     Thyroid: No thyromegaly.  Cardiovascular:     Rate and Rhythm: Normal rate and regular rhythm.     Heart sounds: Normal heart sounds. No murmur.  Pulmonary:     Effort: Pulmonary effort is normal. No respiratory distress.     Breath sounds: Normal breath sounds.  Abdominal:     General: There is no distension.     Palpations: Abdomen is soft.     Tenderness: There is no abdominal tenderness.  Musculoskeletal:     Cervical back: Normal range of motion and neck supple.  Lymphadenopathy:     Cervical: No cervical adenopathy.  Skin:    General: Skin is warm and dry.  Neurological:     Mental Status: She is alert and oriented to person, place, and  time.  Psychiatric:        Behavior: Behavior normal.           Assessment & Plan:

## 2020-05-09 ENCOUNTER — Other Ambulatory Visit: Payer: Self-pay | Admitting: General Practice

## 2020-05-09 ENCOUNTER — Other Ambulatory Visit: Payer: Self-pay | Admitting: Family Medicine

## 2020-05-09 MED ORDER — EZETIMIBE 10 MG PO TABS: 10.0000 mg | ORAL_TABLET | Freq: Every day | ORAL | 1 refills | Status: DC

## 2020-09-07 ENCOUNTER — Other Ambulatory Visit: Payer: Self-pay | Admitting: Family Medicine

## 2020-09-29 ENCOUNTER — Encounter: Payer: Self-pay | Admitting: Family Medicine

## 2020-09-29 ENCOUNTER — Ambulatory Visit (INDEPENDENT_AMBULATORY_CARE_PROVIDER_SITE_OTHER): Payer: 59 | Admitting: Family Medicine

## 2020-09-29 ENCOUNTER — Other Ambulatory Visit: Payer: Self-pay

## 2020-09-29 VITALS — BP 134/80 | HR 56 | Temp 97.1°F | Resp 16 | Ht 59.0 in | Wt 137.6 lb

## 2020-09-29 DIAGNOSIS — Z23 Encounter for immunization: Secondary | ICD-10-CM

## 2020-09-29 DIAGNOSIS — Z0000 Encounter for general adult medical examination without abnormal findings: Secondary | ICD-10-CM | POA: Diagnosis not present

## 2020-09-29 DIAGNOSIS — E119 Type 2 diabetes mellitus without complications: Secondary | ICD-10-CM | POA: Diagnosis not present

## 2020-09-29 LAB — LIPID PANEL
Cholesterol: 182 mg/dL (ref 0–200)
HDL: 72 mg/dL (ref >39.00)
LDL Cholesterol: 85 mg/dL (ref 0–99)
NonHDL: 109.95
Total CHOL/HDL Ratio: 3
Triglycerides: 123 mg/dL (ref 0.0–149.0)
VLDL: 24.6 mg/dL (ref 0.0–40.0)

## 2020-09-29 LAB — CBC WITH DIFFERENTIAL/PLATELET
Basophils Absolute: 0.1 10*3/uL (ref 0.0–0.1)
Basophils Relative: 0.8 % (ref 0.0–3.0)
Eosinophils Absolute: 0.1 10*3/uL (ref 0.0–0.7)
Eosinophils Relative: 1.3 % (ref 0.0–5.0)
HCT: 39.4 % (ref 36.0–46.0)
Hemoglobin: 12.4 g/dL (ref 12.0–15.0)
Lymphocytes Relative: 19.4 % (ref 12.0–46.0)
Lymphs Abs: 1.3 10*3/uL (ref 0.7–4.0)
MCHC: 31.6 g/dL (ref 30.0–36.0)
MCV: 66.3 fl — ABNORMAL LOW (ref 78.0–100.0)
Monocytes Absolute: 0.5 10*3/uL (ref 0.1–1.0)
Monocytes Relative: 7.3 % (ref 3.0–12.0)
Neutro Abs: 4.8 10*3/uL (ref 1.4–7.7)
Neutrophils Relative %: 71.2 % (ref 43.0–77.0)
Platelets: 219 10*3/uL (ref 150.0–400.0)
RBC: 5.94 Mil/uL — ABNORMAL HIGH (ref 3.87–5.11)
RDW: 16 % — ABNORMAL HIGH (ref 11.5–15.5)
WBC: 6.7 10*3/uL (ref 4.0–10.5)

## 2020-09-29 LAB — HEPATIC FUNCTION PANEL
ALT: 23 U/L (ref 0–35)
AST: 52 U/L — ABNORMAL HIGH (ref 0–37)
Albumin: 4.6 g/dL (ref 3.5–5.2)
Alkaline Phosphatase: 66 U/L (ref 39–117)
Bilirubin, Direct: 0.1 mg/dL (ref 0.0–0.3)
Total Bilirubin: 0.5 mg/dL (ref 0.2–1.2)
Total Protein: 7 g/dL (ref 6.0–8.3)

## 2020-09-29 LAB — BASIC METABOLIC PANEL
BUN: 17 mg/dL (ref 6–23)
CO2: 31 mEq/L (ref 19–32)
Calcium: 9.5 mg/dL (ref 8.4–10.5)
Chloride: 99 mEq/L (ref 96–112)
Creatinine, Ser: 0.62 mg/dL (ref 0.40–1.20)
GFR: 94.61 mL/min (ref >60.00)
Glucose, Bld: 116 mg/dL — ABNORMAL HIGH (ref 70–99)
Potassium: 3.4 mEq/L — ABNORMAL LOW (ref 3.5–5.1)
Sodium: 138 mEq/L (ref 135–145)

## 2020-09-29 LAB — TSH: TSH: 0.64 u[IU]/mL (ref 0.35–4.50)

## 2020-09-29 LAB — HEMOGLOBIN A1C: Hgb A1c MFr Bld: 6.5 % (ref 4.6–6.5)

## 2020-09-29 NOTE — Patient Instructions (Signed)
Follow up in 6 months to recheck BP, cholesterol, and diabetes We'll notify you of your lab results and make any changes if needed Continue to work on healthy diet and regular exercise- you look great! Call with any questions or concerns Stay Safe!  Stay Healthy!

## 2020-09-29 NOTE — Addendum Note (Signed)
Addended by: Davis Gourd on: 09/29/2020 09:46 AM   Modules accepted: Orders

## 2020-09-29 NOTE — Progress Notes (Signed)
   Subjective:    Patient ID: Abigail Ellis, female    DOB: 03/11/1956, 64 y.o.   MRN: [id number]  HPI CPE- UTD on mammo, eye exam, foot exam.  Due for colonoscopy- pt states due next year (7 yr recall), Prevnar, Flu shot.  UTD on Tdap and COVID.  Reviewed past medical, surgical, family and social histories.   Health Maintenance  Topic Date Due  . COVID-19 Vaccine (1) Never done  . HIV Screening  Never done  . PAP SMEAR-Modifier  07/29/2019  . INFLUENZA VACCINE  07/13/2020  . COLONOSCOPY  07/20/2020  . PNA vac Low Risk Adult (1 of 2 - PCV13) 09/21/2020  . HEMOGLOBIN A1C  09/26/2020  . OPHTHALMOLOGY EXAM  12/27/2020  . FOOT EXAM  03/27/2021  . URINE MICROALBUMIN  03/27/2021  . MAMMOGRAM  12/24/2021  . TETANUS/TDAP  01/22/2026  . DEXA SCAN  Completed  . Hepatitis C Screening  Completed      Review of Systems Patient reports no vision/ hearing changes, adenopathy,fever, weight change,  persistant/recurrent hoarseness , swallowing issues, chest pain, palpitations, edema, persistant/recurrent cough, hemoptysis, dyspnea (rest/exertional/paroxysmal nocturnal), gastrointestinal bleeding (melena, rectal bleeding), abdominal pain, significant heartburn, bowel changes, GU symptoms (dysuria, hematuria, incontinence), Gyn symptoms (abnormal  bleeding, pain),  syncope, focal weakness, memory loss, numbness & tingling, skin/hair/nail changes, abnormal bruising or bleeding, anxiety, or depression.   This visit occurred during the SARS-CoV-2 public health emergency.  Safety protocols were in place, including screening questions prior to the visit, additional usage of staff PPE, and extensive cleaning of exam room while observing appropriate contact time as indicated for disinfecting solutions.       Objective:   Physical Exam General Appearance:    Alert, cooperative, no distress, appears stated age  Head:    Normocephalic, without obvious abnormality, atraumatic  Eyes:    PERRL,  conjunctiva/corneas clear, EOM's intact, fundi    benign, both eyes  Ears:    Normal TM's and external ear canals, both ears  Nose:   Deferred due to COVID  Throat:   Neck:   Supple, symmetrical, trachea midline, no adenopathy;    Thyroid: no enlargement/tenderness/nodules  Back:     Symmetric, no curvature, ROM normal, no CVA tenderness  Lungs:     Clear to auscultation bilaterally, respirations unlabored  Chest Wall:    No tenderness or deformity   Heart:    Regular rate and rhythm, S1 and S2 normal, no murmur, rub   or gallop  Breast Exam:    Deferred to mammo  Abdomen:     Soft, non-tender, bowel sounds active all four quadrants,    no masses, no organomegaly  Genitalia:    Deferred  Rectal:    Extremities:   Extremities normal, atraumatic, no cyanosis or edema  Pulses:   2+ and symmetric all extremities  Skin:   Skin color, texture, turgor normal, no rashes or lesions  Lymph nodes:   Cervical, supraclavicular, and axillary nodes normal  Neurologic:   CNII-XII intact, normal strength, sensation and reflexes    throughout          Assessment & Plan:

## 2020-09-29 NOTE — Assessment & Plan Note (Signed)
Pt's PE WNL.  UTD on mammo.  No need for pap at this age.  Pt states repeat colonoscopy is due next year w/ Dr Collene Mares.  Flu and Prevnar given today.  UTD on COVID vaccines- including booster.  Check labs.  Anticipatory guidance provided.

## 2020-09-29 NOTE — Assessment & Plan Note (Signed)
Ongoing issue for pt.  UTD on foot exam, eye exam, and microalbumin.  Check labs and start meds if needed

## 2020-09-30 ENCOUNTER — Other Ambulatory Visit: Payer: Self-pay | Admitting: General Practice

## 2020-09-30 DIAGNOSIS — R7989 Other specified abnormal findings of blood chemistry: Secondary | ICD-10-CM

## 2020-09-30 DIAGNOSIS — R945 Abnormal results of liver function studies: Secondary | ICD-10-CM

## 2020-10-14 ENCOUNTER — Other Ambulatory Visit: Payer: Self-pay

## 2020-10-14 ENCOUNTER — Ambulatory Visit (INDEPENDENT_AMBULATORY_CARE_PROVIDER_SITE_OTHER): Payer: 59 | Admitting: *Deleted

## 2020-10-14 DIAGNOSIS — R945 Abnormal results of liver function studies: Secondary | ICD-10-CM | POA: Diagnosis not present

## 2020-10-14 DIAGNOSIS — R7989 Other specified abnormal findings of blood chemistry: Secondary | ICD-10-CM

## 2020-10-14 LAB — HEPATIC FUNCTION PANEL
ALT: 25 U/L (ref 0–35)
AST: 49 U/L — ABNORMAL HIGH (ref 0–37)
Albumin: 4.5 g/dL (ref 3.5–5.2)
Alkaline Phosphatase: 63 U/L (ref 39–117)
Bilirubin, Direct: 0.1 mg/dL (ref 0.0–0.3)
Total Bilirubin: 0.4 mg/dL (ref 0.2–1.2)
Total Protein: 7.1 g/dL (ref 6.0–8.3)

## 2020-10-15 ENCOUNTER — Encounter: Payer: Self-pay | Admitting: General Practice

## 2020-11-11 ENCOUNTER — Other Ambulatory Visit: Payer: Self-pay | Admitting: Family Medicine

## 2020-11-11 DIAGNOSIS — Z1231 Encounter for screening mammogram for malignant neoplasm of breast: Secondary | ICD-10-CM

## 2020-12-26 ENCOUNTER — Ambulatory Visit: Payer: 59

## 2021-01-16 ENCOUNTER — Other Ambulatory Visit: Payer: Self-pay

## 2021-01-16 ENCOUNTER — Ambulatory Visit
Admission: RE | Admit: 2021-01-16 | Discharge: 2021-01-16 | Disposition: A | Discharge status: Home / Self Care | Payer: 59 | Source: Ambulatory Visit | Attending: Family Medicine | Admitting: Family Medicine

## 2021-01-16 DIAGNOSIS — Z1231 Encounter for screening mammogram for malignant neoplasm of breast: Secondary | ICD-10-CM

## 2021-02-03 ENCOUNTER — Other Ambulatory Visit: Payer: Self-pay | Admitting: Family Medicine

## 2021-03-31 ENCOUNTER — Ambulatory Visit: Payer: 59 | Admitting: Family Medicine

## 2021-04-07 ENCOUNTER — Encounter: Payer: Self-pay | Admitting: Family Medicine

## 2021-04-07 ENCOUNTER — Ambulatory Visit: Payer: 59 | Admitting: Family Medicine

## 2021-04-07 ENCOUNTER — Other Ambulatory Visit: Payer: Self-pay

## 2021-04-07 ENCOUNTER — Other Ambulatory Visit: Payer: Self-pay | Admitting: Family Medicine

## 2021-04-07 VITALS — BP 140/90 | HR 51 | Temp 98.1°F | Resp 19 | Ht 59.0 in | Wt 141.6 lb

## 2021-04-07 DIAGNOSIS — E119 Type 2 diabetes mellitus without complications: Secondary | ICD-10-CM | POA: Diagnosis not present

## 2021-04-07 DIAGNOSIS — E785 Hyperlipidemia, unspecified: Secondary | ICD-10-CM

## 2021-04-07 DIAGNOSIS — I10 Essential (primary) hypertension: Secondary | ICD-10-CM

## 2021-04-07 DIAGNOSIS — K5730 Diverticulosis of large intestine without perforation or abscess without bleeding: Secondary | ICD-10-CM | POA: Insufficient documentation

## 2021-04-07 DIAGNOSIS — K641 Second degree hemorrhoids: Secondary | ICD-10-CM | POA: Insufficient documentation

## 2021-04-07 DIAGNOSIS — K600 Acute anal fissure: Secondary | ICD-10-CM | POA: Insufficient documentation

## 2021-04-07 DIAGNOSIS — K625 Hemorrhage of anus and rectum: Secondary | ICD-10-CM | POA: Insufficient documentation

## 2021-04-07 DIAGNOSIS — Z800 Family history of malignant neoplasm of digestive organs: Secondary | ICD-10-CM | POA: Insufficient documentation

## 2021-04-07 LAB — BASIC METABOLIC PANEL
BUN: 15 mg/dL (ref 6–23)
CO2: 30 mEq/L (ref 19–32)
Calcium: 9.4 mg/dL (ref 8.4–10.5)
Chloride: 102 mEq/L (ref 96–112)
Creatinine, Ser: 0.66 mg/dL (ref 0.40–1.20)
GFR: 91.98 mL/min (ref >60.00)
Glucose, Bld: 95 mg/dL (ref 70–99)
Potassium: 3.5 mEq/L (ref 3.5–5.1)
Sodium: 140 mEq/L (ref 135–145)

## 2021-04-07 LAB — CBC WITH DIFFERENTIAL/PLATELET
Basophils Absolute: 0 10*3/uL (ref 0.0–0.1)
Basophils Relative: 0.7 % (ref 0.0–3.0)
Eosinophils Absolute: 0.1 10*3/uL (ref 0.0–0.7)
Eosinophils Relative: 1.5 % (ref 0.0–5.0)
HCT: 38.7 % (ref 36.0–46.0)
Hemoglobin: 12.2 g/dL (ref 12.0–15.0)
Lymphocytes Relative: 23.9 % (ref 12.0–46.0)
Lymphs Abs: 1.2 10*3/uL (ref 0.7–4.0)
MCHC: 31.6 g/dL (ref 30.0–36.0)
MCV: 65.3 fl — ABNORMAL LOW (ref 78.0–100.0)
Monocytes Absolute: 0.4 10*3/uL (ref 0.1–1.0)
Monocytes Relative: 7.7 % (ref 3.0–12.0)
Neutro Abs: 3.4 10*3/uL (ref 1.4–7.7)
Neutrophils Relative %: 66.2 % (ref 43.0–77.0)
Platelets: 239 10*3/uL (ref 150.0–400.0)
RBC: 5.93 Mil/uL — ABNORMAL HIGH (ref 3.87–5.11)
RDW: 15.7 % — ABNORMAL HIGH (ref 11.5–15.5)
WBC: 5.1 10*3/uL (ref 4.0–10.5)

## 2021-04-07 LAB — HEPATIC FUNCTION PANEL
ALT: 29 U/L (ref 0–35)
AST: 47 U/L — ABNORMAL HIGH (ref 0–37)
Albumin: 4.3 g/dL (ref 3.5–5.2)
Alkaline Phosphatase: 58 U/L (ref 39–117)
Bilirubin, Direct: 0.1 mg/dL (ref 0.0–0.3)
Total Bilirubin: 0.6 mg/dL (ref 0.2–1.2)
Total Protein: 6.9 g/dL (ref 6.0–8.3)

## 2021-04-07 LAB — LIPID PANEL
Cholesterol: 157 mg/dL (ref 0–200)
HDL: 72 mg/dL (ref >39.00)
LDL Cholesterol: 68 mg/dL (ref 0–99)
NonHDL: 85.17
Total CHOL/HDL Ratio: 2
Triglycerides: 86 mg/dL (ref 0.0–149.0)
VLDL: 17.2 mg/dL (ref 0.0–40.0)

## 2021-04-07 LAB — HEMOGLOBIN A1C: Hgb A1c MFr Bld: 6.5 % (ref 4.6–6.5)

## 2021-04-07 LAB — TSH: TSH: 0.62 u[IU]/mL (ref 0.35–4.50)

## 2021-04-07 LAB — MICROALBUMIN / CREATININE URINE RATIO
Creatinine,U: 29.6 mg/dL
Microalb Creat Ratio: 2.4 mg/g (ref 0.0–30.0)
Microalb, Ur: <0.7 mg/dL (ref 0.0–1.9)

## 2021-04-07 NOTE — Patient Instructions (Addendum)
Schedule your complete physical in 6 months We'll notify you of your lab results and make any changes if needed Continue to work on healthy diet and regular exercise- you look great! Have them send me a copy of your eye exam Call with any questions or concerns Happy Mother's Day!!!

## 2021-04-07 NOTE — Assessment & Plan Note (Signed)
Chronic problem.  Hx of good control w/ lifestyle modifications.  Eye exam scheduled.  Foot exam done.  Microalbumin ordered.  Check labs and adjust plan prn.

## 2021-04-07 NOTE — Progress Notes (Signed)
   Subjective:    Patient ID: Abigail Ellis, female    DOB: 1955-12-31, 65 y.o.   MRN: [id number]  HPI HTN- chronic problem.  On Amlodipine 5mg  daily, HCTZ 12.5mg  daily, Toprol XL 50mg  daily.  No CP, SOB, HAs, visual changes, edema.  BP is elevated today due to lack of sleep.  Typically BP is better controlled- pt can check this at work  Hyperlipidemia- chronic problem, on Zetia 10mg  daily and Crestor 10mg  daily.  No abd pain, N/V.  DM- chronic problem.  Has been attempting to control w/ diet and exercise.  Due for eye exam (scheduled for 5/14), foot exam, microalbumin.  No numbness/tingling of hands/feet.   Review of Systems For ROS see HPI   This visit occurred during the SARS-CoV-2 public health emergency.  Safety protocols were in place, including screening questions prior to the visit, additional usage of staff PPE, and extensive cleaning of exam room while observing appropriate contact time as indicated for disinfecting solutions.       Objective:   Physical Exam Vitals reviewed.  Constitutional:      General: She is not in acute distress.    Appearance: Normal appearance. She is well-developed.  HENT:     Head: Normocephalic and atraumatic.  Eyes:     Conjunctiva/sclera: Conjunctivae normal.     Pupils: Pupils are equal, round, and reactive to light.  Neck:     Thyroid: No thyromegaly.  Cardiovascular:     Rate and Rhythm: Normal rate and regular rhythm.     Pulses: Normal pulses.     Heart sounds: Normal heart sounds. No murmur heard.   Pulmonary:     Effort: Pulmonary effort is normal. No respiratory distress.     Breath sounds: Normal breath sounds.  Abdominal:     General: There is no distension.     Palpations: Abdomen is soft.     Tenderness: There is no abdominal tenderness.  Musculoskeletal:     Cervical back: Normal range of motion and neck supple.     Right lower leg: No edema.     Left lower leg: No edema.  Lymphadenopathy:     Cervical: No  cervical adenopathy.  Skin:    General: Skin is warm and dry.  Neurological:     Mental Status: She is alert and oriented to person, place, and time.  Psychiatric:        Mood and Affect: Mood normal.        Behavior: Behavior normal.        Thought Content: Thought content normal.           Assessment & Plan:

## 2021-04-07 NOTE — Assessment & Plan Note (Signed)
Chronic problem.  On Crestor 10mg  daily and Zetia 10mg  daily w/o difficulty.  Check labs.  Adjust meds prn

## 2021-04-07 NOTE — Assessment & Plan Note (Signed)
Chronic problem.  BP is elevated today but pt reports she has been sleeping poorly recently.  She works at [employer] and is able to monitor this.  Check labs.  No anticipated med changes.

## 2021-04-25 LAB — HM DIABETES EYE EXAM

## 2021-05-18 ENCOUNTER — Other Ambulatory Visit: Payer: Self-pay | Admitting: Family Medicine

## 2021-05-18 ENCOUNTER — Other Ambulatory Visit: Payer: Self-pay

## 2021-05-18 DIAGNOSIS — E785 Hyperlipidemia, unspecified: Secondary | ICD-10-CM

## 2021-05-18 MED ORDER — ROSUVASTATIN CALCIUM 10 MG PO TABS: 10.0000 mg | ORAL_TABLET | Freq: Every day | ORAL | 1 refills | Status: DC

## 2021-06-10 ENCOUNTER — Encounter: Payer: Self-pay | Admitting: *Deleted

## 2021-07-30 ENCOUNTER — Other Ambulatory Visit: Payer: Self-pay | Admitting: Family Medicine

## 2021-09-03 ENCOUNTER — Other Ambulatory Visit: Payer: Self-pay | Admitting: Family Medicine

## 2021-10-03 ENCOUNTER — Other Ambulatory Visit: Payer: Self-pay | Admitting: Family Medicine

## 2021-10-03 DIAGNOSIS — E785 Hyperlipidemia, unspecified: Secondary | ICD-10-CM

## 2021-10-05 ENCOUNTER — Ambulatory Visit (INDEPENDENT_AMBULATORY_CARE_PROVIDER_SITE_OTHER): Payer: 59 | Admitting: Family Medicine

## 2021-10-05 ENCOUNTER — Other Ambulatory Visit: Payer: Self-pay

## 2021-10-05 ENCOUNTER — Encounter: Payer: Self-pay | Admitting: Family Medicine

## 2021-10-05 VITALS — BP 120/80 | HR 67 | Temp 97.7°F | Resp 16 | Ht 59.0 in | Wt 142.8 lb

## 2021-10-05 DIAGNOSIS — Z23 Encounter for immunization: Secondary | ICD-10-CM | POA: Diagnosis not present

## 2021-10-05 DIAGNOSIS — Z0000 Encounter for general adult medical examination without abnormal findings: Secondary | ICD-10-CM | POA: Diagnosis not present

## 2021-10-05 DIAGNOSIS — E119 Type 2 diabetes mellitus without complications: Secondary | ICD-10-CM | POA: Diagnosis not present

## 2021-10-05 LAB — HEPATIC FUNCTION PANEL
ALT: 24 U/L (ref 0–35)
AST: 50 U/L — ABNORMAL HIGH (ref 0–37)
Albumin: 4.7 g/dL (ref 3.5–5.2)
Alkaline Phosphatase: 64 U/L (ref 39–117)
Bilirubin, Direct: 0.1 mg/dL (ref 0.0–0.3)
Total Bilirubin: 0.6 mg/dL (ref 0.2–1.2)
Total Protein: 7.3 g/dL (ref 6.0–8.3)

## 2021-10-05 LAB — CBC WITH DIFFERENTIAL/PLATELET
Basophils Absolute: 0 10*3/uL (ref 0.0–0.1)
Basophils Relative: 0.4 % (ref 0.0–3.0)
Eosinophils Absolute: 0.1 10*3/uL (ref 0.0–0.7)
Eosinophils Relative: 1.1 % (ref 0.0–5.0)
HCT: 40.2 % (ref 36.0–46.0)
Hemoglobin: 12.5 g/dL (ref 12.0–15.0)
Lymphocytes Relative: 20.5 % (ref 12.0–46.0)
Lymphs Abs: 1.2 10*3/uL (ref 0.7–4.0)
MCHC: 31.2 g/dL (ref 30.0–36.0)
MCV: 65.6 fl — ABNORMAL LOW (ref 78.0–100.0)
Monocytes Absolute: 0.5 10*3/uL (ref 0.1–1.0)
Monocytes Relative: 9.1 % (ref 3.0–12.0)
Neutro Abs: 4.1 10*3/uL (ref 1.4–7.7)
Neutrophils Relative %: 68.9 % (ref 43.0–77.0)
Platelets: 219 10*3/uL (ref 150.0–400.0)
RBC: 6.12 Mil/uL — ABNORMAL HIGH (ref 3.87–5.11)
RDW: 15.6 % — ABNORMAL HIGH (ref 11.5–15.5)
WBC: 5.9 10*3/uL (ref 4.0–10.5)

## 2021-10-05 LAB — BASIC METABOLIC PANEL
BUN: 25 mg/dL — ABNORMAL HIGH (ref 6–23)
CO2: 31 mEq/L (ref 19–32)
Calcium: 10.1 mg/dL (ref 8.4–10.5)
Chloride: 99 mEq/L (ref 96–112)
Creatinine, Ser: 0.81 mg/dL (ref 0.40–1.20)
GFR: 75.85 mL/min (ref >60.00)
Glucose, Bld: 116 mg/dL — ABNORMAL HIGH (ref 70–99)
Potassium: 3.9 mEq/L (ref 3.5–5.1)
Sodium: 139 mEq/L (ref 135–145)

## 2021-10-05 LAB — LIPID PANEL
Cholesterol: 170 mg/dL (ref 0–200)
HDL: 69.8 mg/dL (ref >39.00)
LDL Cholesterol: 80 mg/dL (ref 0–99)
NonHDL: 99.79
Total CHOL/HDL Ratio: 2
Triglycerides: 98 mg/dL (ref 0.0–149.0)
VLDL: 19.6 mg/dL (ref 0.0–40.0)

## 2021-10-05 LAB — TSH: TSH: 0.51 u[IU]/mL (ref 0.35–5.50)

## 2021-10-05 LAB — HEMOGLOBIN A1C: Hgb A1c MFr Bld: 6.4 % (ref 4.6–6.5)

## 2021-10-05 MED ORDER — TOPROL XL 100 MG PO TB24: 100.0000 mg | ORAL_TABLET | Freq: Every day | ORAL | 3 refills | Status: DC

## 2021-10-05 NOTE — Progress Notes (Signed)
Subjective:    Patient ID: Abigail Ellis, female    DOB: Nov 12, 1956, 65 y.o.   MRN: [id number]  HPI CPE- UTD on colonoscopy (Dr Collene Mares says 2024), mammo, eye exam, foot exam, microalbumin.  UTD on Prevnar, due for Pneumovax.  UTD on flu.  No concerns today.  Plans to retire in Feb  Patient Care Team    Relationship Specialty Notifications Start End  Midge Minium, MD PCP - General   11/17/10   Juanita Craver, MD Consulting Physician Gastroenterology  09/29/20     Health Maintenance  Topic Date Due   Zoster Vaccines- Shingrix (1 of 2) Never done   COVID-19 Vaccine (5 - Booster for Pfizer series) 05/26/2021   Pneumonia Vaccine 47+ Years old (2 - PPSV23 if available, else PCV20) 09/29/2021   COLONOSCOPY (Pts 45-65yrs Insurance coverage will need to be confirmed)  07/21/2023 (Originally 07/20/2020)   HEMOGLOBIN A1C  10/07/2021   FOOT EXAM  04/07/2022   URINE MICROALBUMIN  04/07/2022   OPHTHALMOLOGY EXAM  04/25/2022   MAMMOGRAM  01/16/2023   TETANUS/TDAP  01/22/2026   INFLUENZA VACCINE  Completed   DEXA SCAN  Completed   Hepatitis C Screening  Completed   HPV VACCINES  Aged Out      Review of Systems Patient reports no vision/ hearing changes, adenopathy,fever, weight change,  persistant/recurrent hoarseness , swallowing issues, chest pain, palpitations, edema, persistant/recurrent cough, hemoptysis, dyspnea (rest/exertional/paroxysmal nocturnal), gastrointestinal bleeding (melena, rectal bleeding), abdominal pain, significant heartburn, bowel changes, GU symptoms (dysuria, hematuria, incontinence), Gyn symptoms (abnormal  bleeding, pain),  syncope, focal weakness, memory loss, numbness & tingling, skin/hair/nail changes, abnormal bruising or bleeding, anxiety, or depression.   This visit occurred during the SARS-CoV-2 public health emergency.  Safety protocols were in place, including screening questions prior to the visit, additional usage of staff PPE, and extensive cleaning of  exam room while observing appropriate contact time as indicated for disinfecting solutions.      Objective:   Physical Exam General Appearance:    Alert, cooperative, no distress, appears stated age  Head:    Normocephalic, without obvious abnormality, atraumatic  Eyes:    PERRL, conjunctiva/corneas clear, EOM's intact, fundi    benign, both eyes  Ears:    Normal TM's and external ear canals, both ears  Nose:   Nares normal, septum midline, mucosa normal, no drainage    or sinus tenderness  Throat:   Lips, mucosa, and tongue normal; teeth and gums normal  Neck:   Supple, symmetrical, trachea midline, no adenopathy;    Thyroid: no enlargement/tenderness/nodules  Back:     Symmetric, no curvature, ROM normal, no CVA tenderness  Lungs:     Clear to auscultation bilaterally, respirations unlabored  Chest Wall:    No tenderness or deformity   Heart:    Regular rate and rhythm, S1 and S2 normal, no murmur, rub   or gallop  Breast Exam:    Deferred to mammo  Abdomen:     Soft, non-tender, bowel sounds active all four quadrants,    no masses, no organomegaly  Genitalia:    Deferred  Rectal:    Extremities:   Extremities normal, atraumatic, no cyanosis or edema  Pulses:   2+ and symmetric all extremities  Skin:   Skin color, texture, turgor normal, no rashes or lesions  Lymph nodes:   Cervical, supraclavicular, and axillary nodes normal  Neurologic:   CNII-XII intact, normal strength, sensation and reflexes    throughout  Assessment & Plan:

## 2021-10-05 NOTE — Assessment & Plan Note (Signed)
Pt's PE WNL.  UTD on mammo, colonoscopy, eye exam.  Due for Pneumovax- given today.  Check labs.  Anticipatory guidance provided.

## 2021-10-05 NOTE — Assessment & Plan Note (Signed)
Chronic problem.  Hx of good control.  UTD on eye exam, foot exam, microalbumin.  Check labs and determine if medication is needed.

## 2021-10-05 NOTE — Patient Instructions (Signed)
Follow up in 6 months to recheck sugar, BP, and cholesterol We'll notify you of your lab results and make any changes if needed Continue to work on healthy diet and regular exercise- you're doing great! Call with any questions or concerns Stay Safe!  Stay Healthy! Happy [place] Birthday!!

## 2021-10-20 ENCOUNTER — Ambulatory Visit: Payer: 59 | Admitting: Physician Assistant

## 2021-11-18 ENCOUNTER — Ambulatory Visit: Payer: 59 | Admitting: Physician Assistant

## 2021-11-18 ENCOUNTER — Other Ambulatory Visit: Payer: Self-pay

## 2021-11-18 ENCOUNTER — Encounter: Payer: Self-pay | Admitting: Physician Assistant

## 2021-11-18 DIAGNOSIS — L820 Inflamed seborrheic keratosis: Secondary | ICD-10-CM

## 2021-11-18 DIAGNOSIS — D485 Neoplasm of uncertain behavior of skin: Secondary | ICD-10-CM

## 2021-11-18 DIAGNOSIS — L738 Other specified follicular disorders: Secondary | ICD-10-CM

## 2021-11-18 NOTE — Progress Notes (Signed)
   New Patient   Subjective  Abigail Ellis is a 65 y.o. female who presents for the following: Skin Problem (Here to check patients face. She is developing bumps that itch. She is using just lotion on them. It does bother patient sometimes. ).   The following portions of the chart were reviewed this encounter and updated as appropriate:  Tobacco  Allergies  Meds  Problems  Med Hx  Surg Hx  Fam Hx      Objective  Well appearing patient in no apparent distress; mood and affect are within normal limits.  A focused examination was performed including face. Relevant physical exam findings are noted in the Assessment and Plan.  Head - Anterior (Face) (5) Erythematous stuck-on, waxy plaque.   Mid Forehead Round plaque with central dell        Assessment & Plan  Inflamed seborrheic keratosis Head - Anterior (Face)  Destruction of lesion - Head - Anterior (Face) Complexity: simple   Destruction method: cryotherapy   Informed consent: discussed and consent obtained   Timeout:  patient name, date of birth, surgical site, and procedure verified Lesion destroyed using liquid nitrogen: Yes   Cryotherapy cycles:  1 Outcome: patient tolerated procedure well with no complications   Post-procedure details: wound care instructions given    Neoplasm of uncertain behavior of skin Mid Forehead  Skin / nail biopsy Type of biopsy: tangential   Informed consent: discussed and consent obtained   Timeout: patient name, date of birth, surgical site, and procedure verified   Anesthesia: the lesion was anesthetized in a standard fashion   Anesthetic:  1% lidocaine w/ epinephrine 1-100,000 local infiltration Instrument used: flexible razor blade   Hemostasis achieved with: ferric subsulfate   Outcome: patient tolerated procedure well   Post-procedure details: wound care instructions given    Specimen 1 - Surgical pathology Differential Diagnosis: sgh  Check Margins: No     I,  Satish Hammers, PA-C, have reviewed all documentation's for this visit.  The documentation on 11/18/21 for the exam, diagnosis, procedures and orders are all accurate and complete.

## 2021-11-18 NOTE — Patient Instructions (Signed)

## 2022-01-10 ENCOUNTER — Other Ambulatory Visit: Payer: Self-pay | Admitting: Family Medicine

## 2022-01-19 ENCOUNTER — Other Ambulatory Visit: Payer: Self-pay | Admitting: Family Medicine

## 2022-01-26 ENCOUNTER — Other Ambulatory Visit: Payer: Self-pay | Admitting: Family Medicine

## 2022-01-26 DIAGNOSIS — Z1231 Encounter for screening mammogram for malignant neoplasm of breast: Secondary | ICD-10-CM

## 2022-02-12 ENCOUNTER — Other Ambulatory Visit: Payer: Self-pay

## 2022-02-12 ENCOUNTER — Ambulatory Visit
Admission: RE | Admit: 2022-02-12 | Discharge: 2022-02-12 | Disposition: A | Discharge status: Home / Self Care | Payer: Medicare Other | Source: Ambulatory Visit | Attending: Family Medicine | Admitting: Family Medicine

## 2022-02-12 DIAGNOSIS — Z1231 Encounter for screening mammogram for malignant neoplasm of breast: Secondary | ICD-10-CM

## 2022-04-05 ENCOUNTER — Ambulatory Visit: Payer: 59 | Admitting: Family Medicine

## 2022-04-06 ENCOUNTER — Ambulatory Visit: Payer: 59 | Admitting: Family Medicine

## 2022-04-06 ENCOUNTER — Ambulatory Visit (INDEPENDENT_AMBULATORY_CARE_PROVIDER_SITE_OTHER): Payer: Medicare Other | Admitting: Family Medicine

## 2022-04-06 ENCOUNTER — Encounter: Payer: Self-pay | Admitting: Family Medicine

## 2022-04-06 VITALS — BP 138/82 | HR 62 | Temp 97.3°F | Resp 16 | Wt 140.8 lb

## 2022-04-06 DIAGNOSIS — E119 Type 2 diabetes mellitus without complications: Secondary | ICD-10-CM | POA: Diagnosis not present

## 2022-04-06 DIAGNOSIS — E785 Hyperlipidemia, unspecified: Secondary | ICD-10-CM

## 2022-04-06 DIAGNOSIS — I10 Essential (primary) hypertension: Secondary | ICD-10-CM | POA: Diagnosis not present

## 2022-04-06 LAB — CBC WITH DIFFERENTIAL/PLATELET
Basophils Absolute: 0.1 10*3/uL (ref 0.0–0.1)
Basophils Relative: 1.1 % (ref 0.0–3.0)
Eosinophils Absolute: 0.1 10*3/uL (ref 0.0–0.7)
Eosinophils Relative: 1.4 % (ref 0.0–5.0)
HCT: 38.1 % (ref 36.0–46.0)
Hemoglobin: 12 g/dL (ref 12.0–15.0)
Lymphocytes Relative: 21.8 % (ref 12.0–46.0)
Lymphs Abs: 1.3 10*3/uL (ref 0.7–4.0)
MCHC: 31.4 g/dL (ref 30.0–36.0)
MCV: 65.1 fl — ABNORMAL LOW (ref 78.0–100.0)
Monocytes Absolute: 0.5 10*3/uL (ref 0.1–1.0)
Monocytes Relative: 8.4 % (ref 3.0–12.0)
Neutro Abs: 4 10*3/uL (ref 1.4–7.7)
Neutrophils Relative %: 67.3 % (ref 43.0–77.0)
Platelets: 229 10*3/uL (ref 150.0–400.0)
RBC: 5.86 Mil/uL — ABNORMAL HIGH (ref 3.87–5.11)
RDW: 15.9 % — ABNORMAL HIGH (ref 11.5–15.5)
WBC: 5.9 10*3/uL (ref 4.0–10.5)

## 2022-04-06 LAB — BASIC METABOLIC PANEL
BUN: 23 mg/dL (ref 6–23)
CO2: 30 mEq/L (ref 19–32)
Calcium: 9.6 mg/dL (ref 8.4–10.5)
Chloride: 98 mEq/L (ref 96–112)
Creatinine, Ser: 0.76 mg/dL (ref 0.40–1.20)
GFR: 81.59 mL/min (ref >60.00)
Glucose, Bld: 107 mg/dL — ABNORMAL HIGH (ref 70–99)
Potassium: 3.6 mEq/L (ref 3.5–5.1)
Sodium: 136 mEq/L (ref 135–145)

## 2022-04-06 LAB — LIPID PANEL
Cholesterol: 157 mg/dL (ref 0–200)
HDL: 68.2 mg/dL (ref >39.00)
LDL Cholesterol: 70 mg/dL (ref 0–99)
NonHDL: 88.9
Total CHOL/HDL Ratio: 2
Triglycerides: 97 mg/dL (ref 0.0–149.0)
VLDL: 19.4 mg/dL (ref 0.0–40.0)

## 2022-04-06 LAB — HEPATIC FUNCTION PANEL
ALT: 24 U/L (ref 0–35)
AST: 44 U/L — ABNORMAL HIGH (ref 0–37)
Albumin: 4.7 g/dL (ref 3.5–5.2)
Alkaline Phosphatase: 54 U/L (ref 39–117)
Bilirubin, Direct: 0.1 mg/dL (ref 0.0–0.3)
Total Bilirubin: 0.6 mg/dL (ref 0.2–1.2)
Total Protein: 7.2 g/dL (ref 6.0–8.3)

## 2022-04-06 LAB — MICROALBUMIN / CREATININE URINE RATIO
Creatinine,U: 56.3 mg/dL
Microalb Creat Ratio: 2.4 mg/g (ref 0.0–30.0)
Microalb, Ur: 1.3 mg/dL (ref 0.0–1.9)

## 2022-04-06 LAB — TSH: TSH: 0.44 u[IU]/mL (ref 0.35–5.50)

## 2022-04-06 LAB — HEMOGLOBIN A1C: Hgb A1c MFr Bld: 6.4 % (ref 4.6–6.5)

## 2022-04-06 NOTE — Assessment & Plan Note (Signed)
Chronic problem.  On Crestor '10mg'$  and Zetia '10mg'$  w/o difficulty.  Check labs.  Adjust meds prn  ?

## 2022-04-06 NOTE — Assessment & Plan Note (Signed)
Chronic problem.  Adequate control on Amlodipine '5mg'$ , HCTZ 12.'5mg'$ , and Metoprolol '100mg'$  daily.  Currently asymptomatic.  Check labs due to HCTZ use but no anticipated med changes.  Will follow. ?

## 2022-04-06 NOTE — Progress Notes (Signed)
? ?  Subjective:  ? ? Patient ID: Abigail Ellis, female    DOB: 08-21-1956, 66 y.o.   MRN: [id number] ? ?HPI ?DM- chronic problem.  Has been diet controlled.  Last A1C 6.4%  UTD on eye exam, due for foot exam.  Due for microalbumin.  No numbness/tingling of hands/feet.  No blisters or sores on feet. ? ?HTN- chronic problem, on Amlodipine '5mg'$  daily, HCTZ 12.'5mg'$  daily, and Metoprolol XL '100mg'$  daily w/ adequate control.  No CP, SOB, HAs, visual changes, edema. ? ?Hyperlipidemia- chronic problem, on Crestor '10mg'$  daily and Zetia '10mg'$  daily.  No abd pain, N/V ? ? ?Review of Systems ?For ROS see HPI  ?   ?Objective:  ? Physical Exam ?Vitals reviewed.  ?Constitutional:   ?   General: She is not in acute distress. ?   Appearance: Normal appearance. She is well-developed. She is not ill-appearing.  ?HENT:  ?   Head: Normocephalic and atraumatic.  ?Eyes:  ?   Conjunctiva/sclera: Conjunctivae normal.  ?   Pupils: Pupils are equal, round, and reactive to light.  ?Neck:  ?   Thyroid: No thyromegaly.  ?Cardiovascular:  ?   Rate and Rhythm: Normal rate and regular rhythm.  ?   Pulses: Normal pulses.  ?   Heart sounds: Normal heart sounds. No murmur heard. ?Pulmonary:  ?   Effort: Pulmonary effort is normal. No respiratory distress.  ?   Breath sounds: Normal breath sounds.  ?Abdominal:  ?   General: There is no distension.  ?   Palpations: Abdomen is soft.  ?   Tenderness: There is no abdominal tenderness.  ?Musculoskeletal:  ?   Cervical back: Normal range of motion and neck supple.  ?   Right lower leg: No edema.  ?   Left lower leg: No edema.  ?Lymphadenopathy:  ?   Cervical: No cervical adenopathy.  ?Skin: ?   General: Skin is warm and dry.  ?Neurological:  ?   General: No focal deficit present.  ?   Mental Status: She is alert and oriented to person, place, and time.  ?Psychiatric:     ?   Mood and Affect: Mood normal.     ?   Behavior: Behavior normal.  ? ? ? ? ? ?   ?Assessment & Plan:  ? ? ?

## 2022-04-06 NOTE — Patient Instructions (Signed)
Schedule your complete physical in 6 months ?We'll notify you of your lab results and make any changes if needed ?Keep up the good work on healthy diet and regular exercise- you're doing great!!! ?Call with any questions or concerns ?Stay Safe!  Stay Healthy! ?Have an AMAZING trip!!! ?

## 2022-04-06 NOTE — Assessment & Plan Note (Signed)
Chronic problem.  Last A1C 6.4%.  UTD on eye exam.  Foot exam done today.  Microalbumin ordered.  Currently asymptomatic.  Check labs and determine if meds are needed. ?

## 2022-10-14 ENCOUNTER — Encounter: Payer: Self-pay | Admitting: Lab

## 2022-10-14 ENCOUNTER — Encounter: Payer: Self-pay | Admitting: Family Medicine

## 2022-10-14 ENCOUNTER — Ambulatory Visit (INDEPENDENT_AMBULATORY_CARE_PROVIDER_SITE_OTHER): Payer: Medicare Other | Admitting: Family Medicine

## 2022-10-14 VITALS — BP 130/88 | HR 57 | Temp 97.9°F | Ht 58.5 in | Wt 138.6 lb

## 2022-10-14 DIAGNOSIS — E119 Type 2 diabetes mellitus without complications: Secondary | ICD-10-CM

## 2022-10-14 DIAGNOSIS — Z0000 Encounter for general adult medical examination without abnormal findings: Secondary | ICD-10-CM | POA: Diagnosis not present

## 2022-10-14 LAB — CBC WITH DIFFERENTIAL/PLATELET
Basophils Absolute: 0 10*3/uL (ref 0.0–0.1)
Basophils Relative: 0.7 % (ref 0.0–3.0)
Eosinophils Absolute: 0.1 10*3/uL (ref 0.0–0.7)
Eosinophils Relative: 2 % (ref 0.0–5.0)
HCT: 39.2 % (ref 36.0–46.0)
Hemoglobin: 12.3 g/dL (ref 12.0–15.0)
Lymphocytes Relative: 27.2 % (ref 12.0–46.0)
Lymphs Abs: 1.8 10*3/uL (ref 0.7–4.0)
MCHC: 31.3 g/dL (ref 30.0–36.0)
MCV: 64.6 fl — ABNORMAL LOW (ref 78.0–100.0)
Monocytes Absolute: 0.6 10*3/uL (ref 0.1–1.0)
Monocytes Relative: 9.3 % (ref 3.0–12.0)
Neutro Abs: 4.1 10*3/uL (ref 1.4–7.7)
Neutrophils Relative %: 60.8 % (ref 43.0–77.0)
Platelets: 221 10*3/uL (ref 150.0–400.0)
RBC: 6.07 Mil/uL — ABNORMAL HIGH (ref 3.87–5.11)
RDW: 16.1 % — ABNORMAL HIGH (ref 11.5–15.5)
WBC: 6.7 10*3/uL (ref 4.0–10.5)

## 2022-10-14 LAB — LIPID PANEL
Cholesterol: 170 mg/dL (ref 0–200)
HDL: 65.3 mg/dL (ref >39.00)
NonHDL: 104.59
Total CHOL/HDL Ratio: 3
Triglycerides: 222 mg/dL — ABNORMAL HIGH (ref 0.0–149.0)
VLDL: 44.4 mg/dL — ABNORMAL HIGH (ref 0.0–40.0)

## 2022-10-14 LAB — HEPATIC FUNCTION PANEL
ALT: 21 U/L (ref 0–35)
AST: 35 U/L (ref 0–37)
Albumin: 4.6 g/dL (ref 3.5–5.2)
Alkaline Phosphatase: 65 U/L (ref 39–117)
Bilirubin, Direct: 0.1 mg/dL (ref 0.0–0.3)
Total Bilirubin: 0.3 mg/dL (ref 0.2–1.2)
Total Protein: 7.7 g/dL (ref 6.0–8.3)

## 2022-10-14 LAB — BASIC METABOLIC PANEL
BUN: 20 mg/dL (ref 6–23)
CO2: 33 mEq/L — ABNORMAL HIGH (ref 19–32)
Calcium: 10.1 mg/dL (ref 8.4–10.5)
Chloride: 99 mEq/L (ref 96–112)
Creatinine, Ser: 0.82 mg/dL (ref 0.40–1.20)
GFR: 74.2 mL/min (ref >60.00)
Glucose, Bld: 108 mg/dL — ABNORMAL HIGH (ref 70–99)
Potassium: 3.7 mEq/L (ref 3.5–5.1)
Sodium: 140 mEq/L (ref 135–145)

## 2022-10-14 LAB — HEMOGLOBIN A1C: Hgb A1c MFr Bld: 6.5 % (ref 4.6–6.5)

## 2022-10-14 LAB — TSH: TSH: 1.13 u[IU]/mL (ref 0.35–5.50)

## 2022-10-14 LAB — LDL CHOLESTEROL, DIRECT: Direct LDL: 72 mg/dL

## 2022-10-14 MED ORDER — FREESTYLE LITE TEST VI STRP: ORAL_STRIP | 3 refills | Status: DC

## 2022-10-14 NOTE — Progress Notes (Signed)
   Subjective:    Patient ID: Abigail Ellis, female    DOB: 11-Jun-1956, 66 y.o.   MRN: [id number]  HPI CPE- UTD on colonoscopy, microalbumin, foot exam, eye exam, mammo, Tdap, PNA, flu  Patient Care Team    Relationship Specialty Notifications Start End  Midge Minium, MD PCP - General   11/17/10   Juanita Craver, MD Consulting Physician Gastroenterology  09/29/20   Warren Danes, PA-C Physician Assistant Dermatology  11/18/21      Health Maintenance  Topic Date Due   Medicare Annual Wellness (AWV)  Never done   HEMOGLOBIN A1C  10/06/2022   COVID-19 Vaccine (5 - Pfizer risk series) 10/30/2022 (Originally 05/26/2021)   Zoster Vaccines- Shingrix (1 of 2) 01/14/2023 (Originally 09/21/1974)   COLONOSCOPY (Pts 45-74yr Insurance coverage will need to be confirmed)  07/21/2023 (Originally 07/20/2020)   Diabetic kidney evaluation - GFR measurement  04/07/2023   Diabetic kidney evaluation - Urine ACR  04/07/2023   FOOT EXAM  04/07/2023   OPHTHALMOLOGY EXAM  09/24/2023   MAMMOGRAM  02/13/2024   TETANUS/TDAP  01/22/2026   Pneumonia Vaccine 67 Years old  Completed   INFLUENZA VACCINE  Completed   DEXA SCAN  Completed   Hepatitis C Screening  Completed   HPV VACCINES  Aged Out      Review of Systems Patient reports no vision/ hearing changes, adenopathy,fever, weight change,  persistant/recurrent hoarseness , swallowing issues, chest pain, palpitations, edema, persistant/recurrent cough, hemoptysis, dyspnea (rest/exertional/paroxysmal nocturnal), gastrointestinal bleeding (melena, rectal bleeding), abdominal pain, significant heartburn, bowel changes, GU symptoms (dysuria, hematuria, incontinence), Gyn symptoms (abnormal  bleeding, pain),  syncope, focal weakness, memory loss, numbness & tingling, skin/hair/nail changes, abnormal bruising or bleeding, anxiety, or depression.     Objective:   Physical Exam General Appearance:    Alert, cooperative, no distress, appears stated age   Head:    Normocephalic, without obvious abnormality, atraumatic  Eyes:    PERRL, conjunctiva/corneas clear, EOM's intact both eyes  Ears:    Normal TM's and external ear canals, both ears  Nose:   Nares normal, septum midline, mucosa normal, no drainage    or sinus tenderness  Throat:   Lips, mucosa, and tongue normal; teeth and gums normal  Neck:   Supple, symmetrical, trachea midline, no adenopathy;    Thyroid: no enlargement/tenderness/nodules  Back:     Symmetric, no curvature, ROM normal, no CVA tenderness  Lungs:     Clear to auscultation bilaterally, respirations unlabored  Chest Wall:    No tenderness or deformity   Heart:    Regular rate and rhythm, S1 and S2 normal, no murmur, rub   or gallop  Breast Exam:    Deferred to mammo  Abdomen:     Soft, non-tender, bowel sounds active all four quadrants,    no masses, no organomegaly  Genitalia:    Deferred  Rectal:    Extremities:   Extremities normal, atraumatic, no cyanosis or edema  Pulses:   2+ and symmetric all extremities  Skin:   Skin color, texture, turgor normal, no rashes or lesions  Lymph nodes:   Cervical, supraclavicular, and axillary nodes normal  Neurologic:   CNII-XII intact, normal strength, sensation and reflexes    throughout          Assessment & Plan:

## 2022-10-14 NOTE — Assessment & Plan Note (Signed)
Pt's PE WNL.  UTD on colonoscopy, mammo, Tdap, PNA, flu.  Check labs.  Anticipatory guidance provided.

## 2022-10-14 NOTE — Assessment & Plan Note (Signed)
Ongoing issue for pt.  UTD on microalbumin, foot exam, eye exam.  Has increased her walking.  Applauded her efforts.  Check labs and start meds prn.

## 2022-10-14 NOTE — Patient Instructions (Addendum)
Follow up in 6 months to recheck diabetes and blood pressure We'll notify you of your lab results and make any changes if needed Keep up the good work on healthy diet and regular exercise- you look great! Call with any questions or concerns Stay Safe!  Stay Healthy! Happy Fall!!!

## 2022-10-15 NOTE — Progress Notes (Signed)
Pt seen results Via my chart  

## 2022-10-18 ENCOUNTER — Other Ambulatory Visit: Payer: Self-pay

## 2022-10-18 ENCOUNTER — Telehealth: Payer: Self-pay | Admitting: Family Medicine

## 2022-10-18 MED ORDER — GLUCOSE BLOOD VI STRP: ORAL_STRIP | 3 refills | Status: AC

## 2022-10-18 NOTE — Telephone Encounter (Signed)
Rx for One touch test strips has been sent to pharmacy

## 2022-10-18 NOTE — Telephone Encounter (Signed)
Patient called and stated that her insurance would not cover the test strips that were sent in but they would cover the one touch test strips, she wanted to know if this could be changed.

## 2022-10-18 NOTE — Telephone Encounter (Signed)
Ok to send prescription for covered test strips- test twice daily, #100, 3 refills

## 2022-10-21 ENCOUNTER — Telehealth: Payer: Self-pay

## 2022-10-21 MED ORDER — BLOOD GLUCOSE METER KIT: PACK | 0 refills | Status: DC

## 2022-10-21 MED ORDER — GLUCOSE BLOOD VI STRP: ORAL_STRIP | 12 refills | Status: DC

## 2022-10-21 NOTE — Telephone Encounter (Signed)
[facility] sent a refill request for pt test strips but hey are asking for Ultra Blue and it looks like the pt has the freestyle meter . I left her a VM to call our office so we can clarify the meter name before sending an Rx

## 2022-10-21 NOTE — Telephone Encounter (Signed)
Pt states the insurance will not cover the Freestyle they only cover the One touch

## 2022-10-21 NOTE — Telephone Encounter (Signed)
Ok to send [employer] test strips and a One Touch Ultra meter for pt.  Directions for test strips- test sugars 2 times daily (please include dx code), disp 200, 1 refill

## 2022-10-21 NOTE — Telephone Encounter (Signed)
Sent to the pharmacy, called patient and informed her

## 2022-10-27 ENCOUNTER — Other Ambulatory Visit: Payer: Self-pay

## 2022-10-27 ENCOUNTER — Telehealth: Payer: Self-pay

## 2022-10-27 MED ORDER — GLUCOSE BLOOD VI STRP: ORAL_STRIP | 3 refills | Status: DC

## 2022-10-27 NOTE — Telephone Encounter (Signed)
[facility] has sent a request needing a Rx for Ultra Blue test strips for pt . I can not find this in the system to order . Is there another way that I can order them ? Insurance will not pay for the [facility]

## 2022-10-27 NOTE — Telephone Encounter (Signed)
Wrote on prescription specifically 'Ultra Blue Test Strips'.  Hoping this meets their requirements

## 2022-11-12 ENCOUNTER — Other Ambulatory Visit: Payer: Self-pay | Admitting: Family Medicine

## 2022-11-23 ENCOUNTER — Other Ambulatory Visit: Payer: Self-pay | Admitting: Family Medicine

## 2023-01-18 ENCOUNTER — Other Ambulatory Visit: Payer: Self-pay | Admitting: Family Medicine

## 2023-01-18 DIAGNOSIS — Z1231 Encounter for screening mammogram for malignant neoplasm of breast: Secondary | ICD-10-CM

## 2023-01-26 ENCOUNTER — Ambulatory Visit: Payer: Medicare Other

## 2023-03-14 ENCOUNTER — Ambulatory Visit: Payer: Medicare Other

## 2023-03-18 ENCOUNTER — Telehealth: Payer: Self-pay | Admitting: Family Medicine

## 2023-03-18 NOTE — Telephone Encounter (Signed)
Contacted Abigail Ellis to schedule their annual wellness visit. Patient declined to schedule AWV at this time.   Thank you,  [facility] Support [facility] Direct dial  [phone]

## 2023-04-14 ENCOUNTER — Ambulatory Visit: Payer: Medicare Other | Admitting: Family Medicine

## 2023-05-04 ENCOUNTER — Ambulatory Visit
Admission: RE | Admit: 2023-05-04 | Discharge: 2023-05-04 | Disposition: A | Discharge status: Home / Self Care | Payer: Medicare Other | Source: Ambulatory Visit | Attending: Family Medicine | Admitting: Family Medicine

## 2023-05-04 DIAGNOSIS — Z1231 Encounter for screening mammogram for malignant neoplasm of breast: Secondary | ICD-10-CM

## 2023-05-05 ENCOUNTER — Encounter: Payer: Self-pay | Admitting: Family Medicine

## 2023-05-05 ENCOUNTER — Telehealth: Payer: Self-pay

## 2023-05-05 ENCOUNTER — Ambulatory Visit (INDEPENDENT_AMBULATORY_CARE_PROVIDER_SITE_OTHER): Payer: Medicare Other | Admitting: Family Medicine

## 2023-05-05 VITALS — BP 112/68 | HR 64 | Temp 98.0°F | Resp 17 | Ht 58.5 in | Wt 141.2 lb

## 2023-05-05 DIAGNOSIS — E119 Type 2 diabetes mellitus without complications: Secondary | ICD-10-CM

## 2023-05-05 DIAGNOSIS — I10 Essential (primary) hypertension: Secondary | ICD-10-CM

## 2023-05-05 DIAGNOSIS — E785 Hyperlipidemia, unspecified: Secondary | ICD-10-CM | POA: Diagnosis not present

## 2023-05-05 LAB — CBC WITH DIFFERENTIAL/PLATELET
Basophils Absolute: 0 10*3/uL (ref 0.0–0.1)
Basophils Relative: 0.6 % (ref 0.0–3.0)
Eosinophils Absolute: 0.1 10*3/uL (ref 0.0–0.7)
Eosinophils Relative: 1.1 % (ref 0.0–5.0)
HCT: 38.6 % (ref 36.0–46.0)
Hemoglobin: 12.1 g/dL (ref 12.0–15.0)
Lymphocytes Relative: 22.4 % (ref 12.0–46.0)
Lymphs Abs: 1.3 10*3/uL (ref 0.7–4.0)
MCHC: 31.3 g/dL (ref 30.0–36.0)
MCV: 65.1 fl — ABNORMAL LOW (ref 78.0–100.0)
Monocytes Absolute: 0.7 10*3/uL (ref 0.1–1.0)
Monocytes Relative: 11.6 % (ref 3.0–12.0)
Neutro Abs: 3.6 10*3/uL (ref 1.4–7.7)
Neutrophils Relative %: 64.3 % (ref 43.0–77.0)
Platelets: 219 10*3/uL (ref 150.0–400.0)
RBC: 5.94 Mil/uL — ABNORMAL HIGH (ref 3.87–5.11)
RDW: 15.7 % — ABNORMAL HIGH (ref 11.5–15.5)
WBC: 5.6 10*3/uL (ref 4.0–10.5)

## 2023-05-05 LAB — BASIC METABOLIC PANEL
BUN: 16 mg/dL (ref 6–23)
CO2: 33 mEq/L — ABNORMAL HIGH (ref 19–32)
Calcium: 10.4 mg/dL (ref 8.4–10.5)
Chloride: 96 mEq/L (ref 96–112)
Creatinine, Ser: 0.86 mg/dL (ref 0.40–1.20)
GFR: 69.81 mL/min (ref >60.00)
Glucose, Bld: 115 mg/dL — ABNORMAL HIGH (ref 70–99)
Potassium: 3.8 mEq/L (ref 3.5–5.1)
Sodium: 136 mEq/L (ref 135–145)

## 2023-05-05 LAB — HEPATIC FUNCTION PANEL
ALT: 21 U/L (ref 0–35)
AST: 43 U/L — ABNORMAL HIGH (ref 0–37)
Albumin: 4.4 g/dL (ref 3.5–5.2)
Alkaline Phosphatase: 63 U/L (ref 39–117)
Bilirubin, Direct: 0.1 mg/dL (ref 0.0–0.3)
Total Bilirubin: 0.7 mg/dL (ref 0.2–1.2)
Total Protein: 7.3 g/dL (ref 6.0–8.3)

## 2023-05-05 LAB — TSH: TSH: 0.87 u[IU]/mL (ref 0.35–5.50)

## 2023-05-05 LAB — LIPID PANEL
Cholesterol: 169 mg/dL (ref 0–200)
HDL: 67.5 mg/dL (ref >39.00)
LDL Cholesterol: 74 mg/dL (ref 0–99)
NonHDL: 101.61
Total CHOL/HDL Ratio: 3
Triglycerides: 137 mg/dL (ref 0.0–149.0)
VLDL: 27.4 mg/dL (ref 0.0–40.0)

## 2023-05-05 LAB — MICROALBUMIN / CREATININE URINE RATIO
Creatinine,U: 121.2 mg/dL
Microalb Creat Ratio: 6.1 mg/g (ref 0.0–30.0)
Microalb, Ur: 7.4 mg/dL — ABNORMAL HIGH (ref 0.0–1.9)

## 2023-05-05 LAB — HEMOGLOBIN A1C: Hgb A1c MFr Bld: 6.7 % — ABNORMAL HIGH (ref 4.6–6.5)

## 2023-05-05 NOTE — Patient Instructions (Signed)
Schedule your complete physical in 6 months We'll notify you of your lab results and make any changes if needed Keep up the good work on healthy diet and regular exercise- you look great!! Call with any questions or concerns Stay Safe!  Stay Healthy! Have a great summer!!! 

## 2023-05-05 NOTE — Assessment & Plan Note (Signed)
Chronic problem, on Toprol XL 100mg  daily, HCTZ 12.5mg  daily, and Amlodipine 5mg  daily w/o difficulty and good control.  Check labs due to HCTZ use but no anticipated med changes.  Will follow.

## 2023-05-05 NOTE — Assessment & Plan Note (Signed)
Chronic problem.  Currently diet controlled.  Pt states sugars have been fluctuating.  UTD on eye exam.  Foot exam done today and microalbumin ordered.  Check labs.  Start meds prn.

## 2023-05-05 NOTE — Progress Notes (Signed)
   Subjective:    Patient ID: Abigail Ellis, female    DOB: 10-17-1956, 67 y.o.   MRN: [id number]  HPI DM- currently diet controlled.  UTD on eye exam.  Due for foot exam and microalbumin.  No numbness/tingling of hands/feet.  No sores or blisters on feet.  HTN- chronic problem, on Toprol XL 100mg  daily, HCTZ 12.5mg  daily, and Amlodipine 5mg  daily.  No CP, SOB, HA's, visual changes, edema.  Hyperlipidemia- chronic problem, on Zetia and Crestor 10mg  daily.  No abd pain, N/V.   Review of Systems For ROS see HPI     Objective:   Physical Exam Vitals reviewed.  Constitutional:      General: She is not in acute distress.    Appearance: Normal appearance. She is well-developed. She is not ill-appearing.  HENT:     Head: Normocephalic and atraumatic.  Eyes:     Conjunctiva/sclera: Conjunctivae normal.     Pupils: Pupils are equal, round, and reactive to light.  Neck:     Thyroid: No thyromegaly.  Cardiovascular:     Rate and Rhythm: Normal rate and regular rhythm.     Pulses: Normal pulses.     Heart sounds: Normal heart sounds. No murmur heard. Pulmonary:     Effort: Pulmonary effort is normal. No respiratory distress.     Breath sounds: Normal breath sounds.  Abdominal:     General: There is no distension.     Palpations: Abdomen is soft.     Tenderness: There is no abdominal tenderness.  Musculoskeletal:     Cervical back: Normal range of motion and neck supple.     Right lower leg: No edema.     Left lower leg: No edema.  Lymphadenopathy:     Cervical: No cervical adenopathy.  Skin:    General: Skin is warm and dry.  Neurological:     General: No focal deficit present.     Mental Status: She is alert and oriented to person, place, and time.  Psychiatric:        Mood and Affect: Mood normal.        Behavior: Behavior normal.        Thought Content: Thought content normal.           Assessment & Plan:

## 2023-05-05 NOTE — Telephone Encounter (Signed)
Pt seen results Via my chart  

## 2023-05-05 NOTE — Assessment & Plan Note (Signed)
Chronic problem, on Zetia 10mg  daily and Crestor 10mg  daily w/o difficulty.  Check labs.  Adjust meds prn

## 2023-05-05 NOTE — Telephone Encounter (Signed)
-----   Message from Sheliah Hatch, MD sent at 05/05/2023  3:34 PM EDT ----- Labs are stable and look good.  No changes at this time

## 2023-05-28 ENCOUNTER — Other Ambulatory Visit: Payer: Self-pay | Admitting: Family Medicine

## 2023-05-30 ENCOUNTER — Telehealth: Payer: Self-pay | Admitting: Family Medicine

## 2023-05-30 ENCOUNTER — Other Ambulatory Visit: Payer: Self-pay

## 2023-05-30 DIAGNOSIS — E785 Hyperlipidemia, unspecified: Secondary | ICD-10-CM

## 2023-05-30 MED ORDER — ROSUVASTATIN CALCIUM 10 MG PO TABS
10.0000 mg | ORAL_TABLET | Freq: Every day | ORAL | 3 refills | Status: DC
Start: 2023-05-30 — End: 2023-11-28

## 2023-05-30 NOTE — Telephone Encounter (Signed)
Encourage patient to contact the pharmacy for refills or they can request refills through [facility]   WHAT PHARMACY WOULD THEY LIKE THIS SENT TO:  [facility] - [place], [place] - [address]   MEDICATION NAME & DOSE: rosuvastatin (CRESTOR) 10 MG tablet   NOTES/COMMENTS FROM PATIENT:      Front office please notify patient: It takes 48-72 hours to process rx refill requests Ask patient to call pharmacy to ensure rx is ready before heading there.

## 2023-05-30 NOTE — Telephone Encounter (Signed)
Sent refill

## 2023-09-07 LAB — HM COLONOSCOPY

## 2023-10-07 ENCOUNTER — Other Ambulatory Visit: Payer: Self-pay | Admitting: Family Medicine

## 2023-10-25 ENCOUNTER — Other Ambulatory Visit: Payer: Self-pay | Admitting: Family Medicine

## 2023-11-28 ENCOUNTER — Ambulatory Visit: Payer: Medicare Other | Admitting: Family Medicine

## 2023-11-28 ENCOUNTER — Encounter: Payer: Self-pay | Admitting: Family Medicine

## 2023-11-28 VITALS — BP 134/72 | HR 78 | Temp 98.7°F | Ht 59.0 in | Wt 142.2 lb

## 2023-11-28 DIAGNOSIS — E669 Obesity, unspecified: Secondary | ICD-10-CM | POA: Insufficient documentation

## 2023-11-28 DIAGNOSIS — E119 Type 2 diabetes mellitus without complications: Secondary | ICD-10-CM

## 2023-11-28 DIAGNOSIS — Z0000 Encounter for general adult medical examination without abnormal findings: Secondary | ICD-10-CM

## 2023-11-28 DIAGNOSIS — E785 Hyperlipidemia, unspecified: Secondary | ICD-10-CM

## 2023-11-28 DIAGNOSIS — R748 Abnormal levels of other serum enzymes: Secondary | ICD-10-CM | POA: Insufficient documentation

## 2023-11-28 LAB — LIPID PANEL
Cholesterol: 162 mg/dL (ref 0–200)
HDL: 60.2 mg/dL (ref >39.00)
LDL Cholesterol: 62 mg/dL (ref 0–99)
NonHDL: 101.66
Total CHOL/HDL Ratio: 3
Triglycerides: 200 mg/dL — ABNORMAL HIGH (ref 0.0–149.0)
VLDL: 40 mg/dL (ref 0.0–40.0)

## 2023-11-28 LAB — CBC WITH DIFFERENTIAL/PLATELET
Basophils Absolute: 0 10*3/uL (ref 0.0–0.1)
Basophils Relative: 0.5 % (ref 0.0–3.0)
Eosinophils Absolute: 0.1 10*3/uL (ref 0.0–0.7)
Eosinophils Relative: 1.3 % (ref 0.0–5.0)
HCT: 41.4 % (ref 36.0–46.0)
Hemoglobin: 13.1 g/dL (ref 12.0–15.0)
Lymphocytes Relative: 17.8 % (ref 12.0–46.0)
Lymphs Abs: 1.5 10*3/uL (ref 0.7–4.0)
MCHC: 31.6 g/dL (ref 30.0–36.0)
MCV: 65.8 fL — ABNORMAL LOW (ref 78.0–100.0)
Monocytes Absolute: 0.8 10*3/uL (ref 0.1–1.0)
Monocytes Relative: 9 % (ref 3.0–12.0)
Neutro Abs: 6.1 10*3/uL (ref 1.4–7.7)
Neutrophils Relative %: 71.4 % (ref 43.0–77.0)
Platelets: 254 10*3/uL (ref 150.0–400.0)
RBC: 6.3 Mil/uL — ABNORMAL HIGH (ref 3.87–5.11)
RDW: 15.6 % — ABNORMAL HIGH (ref 11.5–15.5)
WBC: 8.6 10*3/uL (ref 4.0–10.5)

## 2023-11-28 LAB — TSH: TSH: 1.14 u[IU]/mL (ref 0.35–5.50)

## 2023-11-28 LAB — HEPATIC FUNCTION PANEL
ALT: 25 U/L (ref 0–35)
AST: 32 U/L (ref 0–37)
Albumin: 4.7 g/dL (ref 3.5–5.2)
Alkaline Phosphatase: 71 U/L (ref 39–117)
Bilirubin, Direct: 0.1 mg/dL (ref 0.0–0.3)
Total Bilirubin: 0.6 mg/dL (ref 0.2–1.2)
Total Protein: 7.6 g/dL (ref 6.0–8.3)

## 2023-11-28 LAB — BASIC METABOLIC PANEL
BUN: 21 mg/dL (ref 6–23)
CO2: 32 meq/L (ref 19–32)
Calcium: 9.8 mg/dL (ref 8.4–10.5)
Chloride: 97 meq/L (ref 96–112)
Creatinine, Ser: 0.92 mg/dL (ref 0.40–1.20)
GFR: 64.13 mL/min (ref >60.00)
Glucose, Bld: 108 mg/dL — ABNORMAL HIGH (ref 70–99)
Potassium: 3.9 meq/L (ref 3.5–5.1)
Sodium: 139 meq/L (ref 135–145)

## 2023-11-28 LAB — HEMOGLOBIN A1C: Hgb A1c MFr Bld: 6.7 % — ABNORMAL HIGH (ref 4.6–6.5)

## 2023-11-28 MED ORDER — EZETIMIBE 10 MG PO TABS: 10.0000 mg | ORAL_TABLET | Freq: Every day | ORAL | 1 refills | Status: DC

## 2023-11-28 MED ORDER — TOPROL XL 100 MG PO TB24: 100.0000 mg | ORAL_TABLET | Freq: Every day | ORAL | 3 refills | Status: DC

## 2023-11-28 MED ORDER — ROSUVASTATIN CALCIUM 10 MG PO TABS: 10.0000 mg | ORAL_TABLET | Freq: Every day | ORAL | 3 refills | Status: DC

## 2023-11-28 MED ORDER — AMLODIPINE BESYLATE 5 MG PO TABS: 5.0000 mg | ORAL_TABLET | Freq: Every day | ORAL | 3 refills | Status: DC

## 2023-11-28 NOTE — Progress Notes (Signed)
   Subjective:    Patient ID: Abigail Ellis, female    DOB: [birth year], 67 y.o.   MRN: [id number]  HPI CPE- UTD on PNA, flu, Tdap, colonoscopy, mammo, foot exam, microalbumin, eye exam.  Patient Care Team    Relationship Specialty Notifications Start End  Sheliah Hatch, MD PCP - General   11/17/10   Charna Elizabeth, MD Consulting Physician Gastroenterology  09/29/20   Glyn Ade, PA-C Physician Assistant Dermatology  11/18/21     Health Maintenance  Topic Date Due   Medicare Annual Wellness (AWV)  Never done   OPHTHALMOLOGY EXAM  09/24/2023   HEMOGLOBIN A1C  11/05/2023   Diabetic kidney evaluation - eGFR measurement  05/04/2024   Diabetic kidney evaluation - Urine ACR  05/04/2024   FOOT EXAM  05/04/2024   MAMMOGRAM  05/03/2025   DTaP/Tdap/Td (3 - Td or Tdap) 01/22/2026   Colonoscopy  09/06/2030   Pneumonia Vaccine 37+ Years old  Completed   INFLUENZA VACCINE  Completed   DEXA SCAN  Completed   Hepatitis C Screening  Completed   Zoster Vaccines- Shingrix  Completed   HPV VACCINES  Aged Out   COVID-19 Vaccine  Discontinued      Review of Systems Patient reports no vision/ hearing changes, adenopathy,fever, weight change,  persistant/recurrent hoarseness , swallowing issues, chest pain, palpitations, edema, persistant/recurrent cough, hemoptysis, dyspnea (rest/exertional/paroxysmal nocturnal), gastrointestinal bleeding (melena, rectal bleeding), abdominal pain, significant heartburn, bowel changes, GU symptoms (dysuria, hematuria, incontinence), Gyn symptoms (abnormal  bleeding, pain),  syncope, focal weakness, memory loss, numbness & tingling, skin/hair/nail changes, abnormal bruising or bleeding, anxiety, or depression.     Objective:   Physical Exam General Appearance:    Alert, cooperative, no distress, appears stated age  Head:    Normocephalic, without obvious abnormality, atraumatic  Eyes:    PERRL, conjunctiva/corneas clear, EOM's intact both eyes  Ears:     Normal TM's and external ear canals, both ears  Nose:   Nares normal, septum midline, mucosa normal, no drainage    or sinus tenderness  Throat:   Lips, mucosa, and tongue normal; teeth and gums normal  Neck:   Supple, symmetrical, trachea midline, no adenopathy;    Thyroid: no enlargement/tenderness/nodules  Back:     Symmetric, no curvature, ROM normal, no CVA tenderness  Lungs:     Clear to auscultation bilaterally, respirations unlabored  Chest Wall:    No tenderness or deformity   Heart:    Regular rate and rhythm, S1 and S2 normal, no murmur, rub   or gallop  Breast Exam:    Deferred to mammo  Abdomen:     Soft, non-tender, bowel sounds active all four quadrants,    no masses, no organomegaly  Genitalia:    Deferred  Rectal:    Extremities:   Extremities normal, atraumatic, no cyanosis or edema  Pulses:   2+ and symmetric all extremities  Skin:   Skin color, texture, turgor normal, no rashes or lesions  Lymph nodes:   Cervical, supraclavicular, and axillary nodes normal  Neurologic:   CNII-XII intact, normal strength, sensation and reflexes    throughout         Assessment & Plan:

## 2023-11-28 NOTE — Assessment & Plan Note (Signed)
Ongoing issue.  UTD on eye exam, foot exam, microalbumin.  Check labs and determine if meds are needed.

## 2023-11-28 NOTE — Patient Instructions (Signed)
Follow up in 6 months to recheck sugar We'll notify you of your lab results and make any changes if needed Keep up the good work on healthy diet and regular exercise- you look great! Call with any questions or concerns Stay Safe!  Stay Healthy! Happy Holidays!!

## 2023-11-28 NOTE — Assessment & Plan Note (Signed)
Pt's PE WNL.  UTD on pap, mammo, colonoscopy, immunizations, eye exam.  Check labs.  Anticipatory guidance provided.

## 2024-02-16 ENCOUNTER — Other Ambulatory Visit: Payer: Self-pay | Admitting: Family Medicine

## 2024-02-16 DIAGNOSIS — E785 Hyperlipidemia, unspecified: Secondary | ICD-10-CM

## 2024-02-16 NOTE — Telephone Encounter (Signed)
 Last Fill: Rosuvastatin: 11/28/23     Zetia: 11/28/23  Last OV: 11/28/23 Next OV: 05/28/24  Routing to provider for review/authorization.

## 2024-02-17 MED ORDER — EZETIMIBE 10 MG PO TABS: 10.0000 mg | ORAL_TABLET | Freq: Every day | ORAL | 1 refills | Status: DC

## 2024-02-17 MED ORDER — ROSUVASTATIN CALCIUM 10 MG PO TABS: 10.0000 mg | ORAL_TABLET | Freq: Every day | ORAL | 3 refills | Status: DC

## 2024-03-21 ENCOUNTER — Other Ambulatory Visit: Payer: Self-pay | Admitting: Family Medicine

## 2024-03-21 DIAGNOSIS — Z1231 Encounter for screening mammogram for malignant neoplasm of breast: Secondary | ICD-10-CM

## 2024-04-23 ENCOUNTER — Other Ambulatory Visit: Payer: Self-pay | Admitting: Family Medicine

## 2024-05-04 ENCOUNTER — Ambulatory Visit
Admission: RE | Admit: 2024-05-04 | Discharge: 2024-05-04 | Disposition: A | Discharge status: Home / Self Care | Source: Ambulatory Visit | Attending: Family Medicine | Admitting: Family Medicine

## 2024-05-04 DIAGNOSIS — Z1231 Encounter for screening mammogram for malignant neoplasm of breast: Secondary | ICD-10-CM

## 2024-05-14 ENCOUNTER — Encounter: Payer: Self-pay | Admitting: Family Medicine

## 2024-05-14 ENCOUNTER — Ambulatory Visit (INDEPENDENT_AMBULATORY_CARE_PROVIDER_SITE_OTHER): Admitting: Family Medicine

## 2024-05-14 VITALS — BP 132/82 | HR 69 | Temp 98.5°F | Ht 59.0 in | Wt 143.0 lb

## 2024-05-14 DIAGNOSIS — Z800 Family history of malignant neoplasm of digestive organs: Secondary | ICD-10-CM | POA: Insufficient documentation

## 2024-05-14 DIAGNOSIS — R748 Abnormal levels of other serum enzymes: Secondary | ICD-10-CM | POA: Insufficient documentation

## 2024-05-14 DIAGNOSIS — E119 Type 2 diabetes mellitus without complications: Secondary | ICD-10-CM

## 2024-05-14 DIAGNOSIS — I10 Essential (primary) hypertension: Secondary | ICD-10-CM | POA: Diagnosis not present

## 2024-05-14 DIAGNOSIS — E785 Hyperlipidemia, unspecified: Secondary | ICD-10-CM | POA: Diagnosis not present

## 2024-05-14 LAB — CBC WITH DIFFERENTIAL/PLATELET
Basophils Absolute: 0 10*3/uL (ref 0.0–0.1)
Basophils Relative: 0.6 % (ref 0.0–3.0)
Eosinophils Absolute: 0.1 10*3/uL (ref 0.0–0.7)
Eosinophils Relative: 1 % (ref 0.0–5.0)
HCT: 40.2 % (ref 36.0–46.0)
Hemoglobin: 12.7 g/dL (ref 12.0–15.0)
Lymphocytes Relative: 21.8 % (ref 12.0–46.0)
Lymphs Abs: 1.3 10*3/uL (ref 0.7–4.0)
MCHC: 31.5 g/dL (ref 30.0–36.0)
MCV: 64.3 fl — ABNORMAL LOW (ref 78.0–100.0)
Monocytes Absolute: 0.6 10*3/uL (ref 0.1–1.0)
Monocytes Relative: 10.1 % (ref 3.0–12.0)
Neutro Abs: 4 10*3/uL (ref 1.4–7.7)
Neutrophils Relative %: 66.5 % (ref 43.0–77.0)
Platelets: 222 10*3/uL (ref 150.0–400.0)
RBC: 6.25 Mil/uL — ABNORMAL HIGH (ref 3.87–5.11)
RDW: 15.8 % — ABNORMAL HIGH (ref 11.5–15.5)
WBC: 6 10*3/uL (ref 4.0–10.5)

## 2024-05-14 LAB — LIPID PANEL
Cholesterol: 170 mg/dL (ref 0–200)
HDL: 70 mg/dL (ref >39.00)
LDL Cholesterol: 71 mg/dL (ref 0–99)
NonHDL: 99.64
Total CHOL/HDL Ratio: 2
Triglycerides: 142 mg/dL (ref 0.0–149.0)
VLDL: 28.4 mg/dL (ref 0.0–40.0)

## 2024-05-14 LAB — BASIC METABOLIC PANEL WITH GFR
BUN: 19 mg/dL (ref 6–23)
CO2: 30 meq/L (ref 19–32)
Calcium: 9.7 mg/dL (ref 8.4–10.5)
Chloride: 96 meq/L (ref 96–112)
Creatinine, Ser: 0.67 mg/dL (ref 0.40–1.20)
GFR: 89.67 mL/min (ref >60.00)
Glucose, Bld: 109 mg/dL — ABNORMAL HIGH (ref 70–99)
Potassium: 3.8 meq/L (ref 3.5–5.1)
Sodium: 135 meq/L (ref 135–145)

## 2024-05-14 LAB — MICROALBUMIN / CREATININE URINE RATIO
Creatinine,U: 22.2 mg/dL
Microalb Creat Ratio: 54.2 mg/g — ABNORMAL HIGH (ref 0.0–30.0)
Microalb, Ur: 1.2 mg/dL (ref 0.0–1.9)

## 2024-05-14 LAB — HEPATIC FUNCTION PANEL
ALT: 20 U/L (ref 0–35)
AST: 32 U/L (ref 0–37)
Albumin: 4.7 g/dL (ref 3.5–5.2)
Alkaline Phosphatase: 65 U/L (ref 39–117)
Bilirubin, Direct: 0.1 mg/dL (ref 0.0–0.3)
Total Bilirubin: 0.5 mg/dL (ref 0.2–1.2)
Total Protein: 7.5 g/dL (ref 6.0–8.3)

## 2024-05-14 LAB — HEMOGLOBIN A1C: Hgb A1c MFr Bld: 6.7 % — ABNORMAL HIGH (ref 4.6–6.5)

## 2024-05-14 LAB — TSH: TSH: 0.5 u[IU]/mL (ref 0.35–5.50)

## 2024-05-14 NOTE — Assessment & Plan Note (Signed)
 Chronic problem.  On Crestor  10mg  daily, Zetia  10mg  daily w/o difficulty.  Check labs.  Adjust meds prn

## 2024-05-14 NOTE — Assessment & Plan Note (Signed)
 Chronic problem.  Attempting to control w/ lifestyle.  Last A1C 6.7%  UTD on eye exam- will get records.  Foot exam done today.  Microalbumin ordered.  Check labs.  Determine if medication is needed

## 2024-05-14 NOTE — Assessment & Plan Note (Signed)
 Chronic problem.  On Amlodipine  5mg  daily, hydrochlorothiazide  12.5mg  daily, Toprol  XL 100mg  daily.  BP returned to normal range by end of visit.  Currently asymptomatic.  Check labs due to diuretic use but no anticipated med changes.  Will follow.

## 2024-05-14 NOTE — Progress Notes (Signed)
   Subjective:    Patient ID: Abigail Ellis, female    DOB: 14-Feb-1956, 68 y.o.   MRN: [id number]  HPI DM- chronic problem.  Attempting to control w/ lifestyle.  Last A1C 6.7%.  UTD on eye exam.  Due for foot exam and microalbumin.  No numbness/tingling hands/feet.    HTN- chronic problem, on Amlodipine  5mg  daily, hydrochlorothiazide  12.5mg  daily, Toprol  XL 100mg  daily.  BP is slightly elevated today.  Reports BP is normal at home.  No CP, SOB, HA, visual changes.  Hyperlipidemia- chronic problem, on Crestor  10mg  daily, Zetia  10mg  daily.  Denies abd pain, N/V.   Review of Systems For ROS see HPI     Objective:   Physical Exam Vitals reviewed.  Constitutional:      General: She is not in acute distress.    Appearance: Normal appearance. She is well-developed. She is not ill-appearing.  HENT:     Head: Normocephalic and atraumatic.  Eyes:     Conjunctiva/sclera: Conjunctivae normal.     Pupils: Pupils are equal, round, and reactive to light.  Neck:     Thyroid : No thyromegaly.  Cardiovascular:     Rate and Rhythm: Normal rate and regular rhythm.     Pulses: Normal pulses.     Heart sounds: Normal heart sounds. No murmur heard. Pulmonary:     Effort: Pulmonary effort is normal. No respiratory distress.     Breath sounds: Normal breath sounds.  Abdominal:     General: There is no distension.     Palpations: Abdomen is soft.     Tenderness: There is no abdominal tenderness.  Musculoskeletal:     Cervical back: Normal range of motion and neck supple.     Right lower leg: No edema.     Left lower leg: No edema.  Lymphadenopathy:     Cervical: No cervical adenopathy.  Skin:    General: Skin is warm and dry.  Neurological:     General: No focal deficit present.     Mental Status: She is alert and oriented to person, place, and time.  Psychiatric:        Mood and Affect: Mood normal.        Behavior: Behavior normal.        Thought Content: Thought content normal.            Assessment & Plan:

## 2024-05-14 NOTE — Patient Instructions (Signed)
Schedule your complete physical in 6 months We'll notify you of your lab results and make any changes if needed Keep up the good work on healthy diet and regular exercise- you're doing great! Call with any questions or concerns Stay Safe!  Stay Healthy! Have a great summer!! 

## 2024-05-15 ENCOUNTER — Ambulatory Visit: Payer: Self-pay | Admitting: Family Medicine

## 2024-05-17 ENCOUNTER — Telehealth: Payer: Self-pay

## 2024-05-17 MED ORDER — LOSARTAN POTASSIUM 50 MG PO TABS: 50.0000 mg | ORAL_TABLET | Freq: Every day | ORAL | 1 refills | Status: DC

## 2024-05-17 NOTE — Telephone Encounter (Signed)
 Losartan 50mg  has been sen to preferred pharmacy in [place] 

## 2024-05-17 NOTE — Telephone Encounter (Signed)
 Copied from CRM [id number]. Topic: General - Other >> May 17, 2024  1:24 PM Adonis Hoot wrote: Reason for CRM: Patient called to let Tabori know the name of the pharmacy to have her blood pressure medication sent to in [place] :  [id number] - [place], [place] - [address]  Phone: [phone] Fax: [phone]

## 2024-05-28 ENCOUNTER — Ambulatory Visit: Payer: Medicare Other | Admitting: Family Medicine

## 2024-06-19 ENCOUNTER — Telehealth: Payer: Self-pay

## 2024-06-19 DIAGNOSIS — E119 Type 2 diabetes mellitus without complications: Secondary | ICD-10-CM

## 2024-06-19 NOTE — Addendum Note (Signed)
 Addended by: Dyanara Cozza K on: 06/19/2024 12:09 PM   Modules accepted: Orders

## 2024-06-19 NOTE — Telephone Encounter (Signed)
 Appt made

## 2024-06-19 NOTE — Telephone Encounter (Signed)
 Sent to [facility]

## 2024-06-20 ENCOUNTER — Other Ambulatory Visit (INDEPENDENT_AMBULATORY_CARE_PROVIDER_SITE_OTHER)

## 2024-06-20 ENCOUNTER — Ambulatory Visit: Payer: Self-pay | Admitting: Family Medicine

## 2024-06-20 ENCOUNTER — Ambulatory Visit: Admitting: Family Medicine

## 2024-06-20 DIAGNOSIS — E119 Type 2 diabetes mellitus without complications: Secondary | ICD-10-CM | POA: Diagnosis not present

## 2024-06-20 LAB — BASIC METABOLIC PANEL WITH GFR
BUN: 15 mg/dL (ref 6–23)
CO2: 32 meq/L (ref 19–32)
Calcium: 10 mg/dL (ref 8.4–10.5)
Chloride: 102 meq/L (ref 96–112)
Creatinine, Ser: 0.75 mg/dL (ref 0.40–1.20)
GFR: 81.62 mL/min (ref >60.00)
Glucose, Bld: 117 mg/dL — ABNORMAL HIGH (ref 70–99)
Potassium: 4.1 meq/L (ref 3.5–5.1)
Sodium: 141 meq/L (ref 135–145)

## 2024-08-22 ENCOUNTER — Other Ambulatory Visit: Payer: Self-pay | Admitting: Family Medicine

## 2024-09-01 ENCOUNTER — Other Ambulatory Visit: Payer: Self-pay | Admitting: Family Medicine

## 2024-10-12 NOTE — Progress Notes (Signed)
 Abigail Ellis                                          MRN: [id number]   10/12/2024   The [facility] Quality Team Specialist reviewed this patient medical record for the purposes of chart review for care gap closure. The following were reviewed: chart review for care gap closure-diabetic eye exam.    [facility] Quality Team

## 2024-10-19 LAB — HM DIABETES EYE EXAM

## 2024-10-23 ENCOUNTER — Other Ambulatory Visit: Payer: Self-pay

## 2024-10-23 MED ORDER — LOSARTAN POTASSIUM 50 MG PO TABS: 50.0000 mg | ORAL_TABLET | Freq: Every day | ORAL | 1 refills | Status: AC

## 2024-10-30 ENCOUNTER — Other Ambulatory Visit: Payer: Self-pay | Admitting: Family Medicine

## 2024-11-13 ENCOUNTER — Other Ambulatory Visit: Payer: Self-pay | Admitting: Family Medicine

## 2024-11-20 ENCOUNTER — Encounter: Admitting: Family Medicine

## 2024-11-20 NOTE — Telephone Encounter (Signed)
 Patient has already called to schedule

## 2024-11-29 ENCOUNTER — Encounter: Payer: Self-pay | Admitting: Family Medicine

## 2024-11-29 ENCOUNTER — Ambulatory Visit: Admitting: Family Medicine

## 2024-11-29 VITALS — BP 138/72 | HR 64 | Temp 97.9°F | Ht 59.0 in | Wt 145.4 lb

## 2024-11-29 DIAGNOSIS — E119 Type 2 diabetes mellitus without complications: Secondary | ICD-10-CM

## 2024-11-29 DIAGNOSIS — Z0000 Encounter for general adult medical examination without abnormal findings: Secondary | ICD-10-CM | POA: Diagnosis not present

## 2024-11-29 LAB — CBC WITH DIFFERENTIAL/PLATELET
Basophils Absolute: 0 K/uL (ref 0.0–0.1)
Basophils Relative: 0.6 % (ref 0.0–3.0)
Eosinophils Absolute: 0.1 K/uL (ref 0.0–0.7)
Eosinophils Relative: 1.3 % (ref 0.0–5.0)
HCT: 38.5 % (ref 36.0–46.0)
Hemoglobin: 12 g/dL (ref 12.0–15.0)
Lymphocytes Relative: 20.4 % (ref 12.0–46.0)
Lymphs Abs: 1.2 K/uL (ref 0.7–4.0)
MCHC: 31.1 g/dL (ref 30.0–36.0)
MCV: 65.7 fl — ABNORMAL LOW (ref 78.0–100.0)
Monocytes Absolute: 0.5 K/uL (ref 0.1–1.0)
Monocytes Relative: 8.7 % (ref 3.0–12.0)
Neutro Abs: 4.2 K/uL (ref 1.4–7.7)
Neutrophils Relative %: 69 % (ref 43.0–77.0)
Platelets: 232 K/uL (ref 150.0–400.0)
RBC: 5.87 Mil/uL — ABNORMAL HIGH (ref 3.87–5.11)
RDW: 15.9 % — ABNORMAL HIGH (ref 11.5–15.5)
WBC: 6.1 K/uL (ref 4.0–10.5)

## 2024-11-29 LAB — HEPATIC FUNCTION PANEL
ALT: 17 U/L (ref 3–35)
AST: 26 U/L (ref 5–37)
Albumin: 4.6 g/dL (ref 3.5–5.2)
Alkaline Phosphatase: 76 U/L (ref 39–117)
Bilirubin, Direct: 0.1 mg/dL (ref 0.1–0.3)
Total Bilirubin: 0.5 mg/dL (ref 0.2–1.2)
Total Protein: 7.2 g/dL (ref 6.0–8.3)

## 2024-11-29 LAB — LIPID PANEL
Cholesterol: 161 mg/dL (ref 28–200)
HDL: 56.4 mg/dL (ref >39.00)
LDL Cholesterol: 63 mg/dL (ref 10–99)
NonHDL: 104.67
Total CHOL/HDL Ratio: 3
Triglycerides: 209 mg/dL — ABNORMAL HIGH (ref 10.0–149.0)
VLDL: 41.8 mg/dL — ABNORMAL HIGH (ref 0.0–40.0)

## 2024-11-29 LAB — HEMOGLOBIN A1C: Hgb A1c MFr Bld: 6.9 % — ABNORMAL HIGH (ref 4.6–6.5)

## 2024-11-29 LAB — BASIC METABOLIC PANEL WITH GFR
BUN: 18 mg/dL (ref 6–23)
CO2: 30 meq/L (ref 19–32)
Calcium: 9.8 mg/dL (ref 8.4–10.5)
Chloride: 99 meq/L (ref 96–112)
Creatinine, Ser: 0.67 mg/dL (ref 0.40–1.20)
GFR: 89.33 mL/min (ref >60.00)
Glucose, Bld: 124 mg/dL — ABNORMAL HIGH (ref 70–99)
Potassium: 4 meq/L (ref 3.5–5.1)
Sodium: 138 meq/L (ref 135–145)

## 2024-11-29 LAB — TSH: TSH: 0.89 u[IU]/mL (ref 0.35–5.50)

## 2024-11-29 NOTE — Patient Instructions (Signed)
 Follow up in 6 months to recheck blood pressure, diabetes, cholesterol We'll notify you of your lab results and make any changes if needed Keep up the good work on healthy diet and regular exercise- you look great! Take a picture of your eye report and send it via MyChart Call with any questions or concerns Stay Safe!  Stay Healthy! Happy Holidays!!!

## 2024-11-29 NOTE — Assessment & Plan Note (Signed)
 Chronic problem.  UTD on foot exam, eye exam, microalbumin.  Check labs.  Determine if meds are needed

## 2024-11-29 NOTE — Progress Notes (Signed)
° °  Subjective:    Patient ID: Abigail Ellis, female    DOB: June 10, 1956, 68 y.o.   MRN: [id number]  HPI CPE- UTD on foot exam, microalbumin, mammo, Tdap, colonoscopy, PNA, flu.  Pt has had eye exam, will send report via MyChart.  Health Maintenance  Topic Date Due   Medicare Annual Wellness (AWV)  Never done   OPHTHALMOLOGY EXAM  09/24/2023   HEMOGLOBIN A1C  11/13/2024   Diabetic kidney evaluation - Urine ACR  05/14/2025   FOOT EXAM  05/14/2025   Diabetic kidney evaluation - eGFR measurement  06/20/2025   Mammogram  05/04/2026   DTaP/Tdap/Td (4 - Td or Tdap) 11/14/2028   Colonoscopy  09/06/2030   Pneumococcal Vaccine: 50+ Years  Completed   Influenza Vaccine  Completed   Bone Density Scan  Completed   Hepatitis C Screening  Completed   Zoster Vaccines- Shingrix  Completed   Meningococcal B Vaccine  Aged Out   COVID-19 Vaccine  Discontinued    Patient Care Team    Relationship Specialty Notifications Start End  Mahlon Comer BRAVO, MD PCP - General   11/17/10   Kristie Lamprey, MD Consulting Physician Gastroenterology  09/29/20   Sheffield, Kelli R, PA-C (Inactive) Physician Assistant Dermatology  11/18/21       Review of Systems Patient reports no vision/ hearing changes, adenopathy,fever, weight change,  persistant/recurrent hoarseness , swallowing issues, chest pain, palpitations, edema, persistant/recurrent cough, hemoptysis, dyspnea (rest/exertional/paroxysmal nocturnal), gastrointestinal bleeding (melena, rectal bleeding), abdominal pain, significant heartburn, bowel changes, GU symptoms (dysuria, hematuria, incontinence), Gyn symptoms (abnormal  bleeding, pain),  syncope, focal weakness, memory loss, numbness & tingling, skin/hair/nail changes, abnormal bruising or bleeding, anxiety, or depression.     Objective:   Physical Exam General Appearance:    Alert, cooperative, no distress, appears stated age  Head:    Normocephalic, without obvious abnormality, atraumatic  Eyes:     PERRL, conjunctiva/corneas clear, EOM's intact both eyes  Ears:    Normal TM's and external ear canals, both ears  Nose:   Nares normal, septum midline, mucosa normal, no drainage    or sinus tenderness  Throat:   Lips, mucosa, and tongue normal; teeth and gums normal  Neck:   Supple, symmetrical, trachea midline, no adenopathy;    Thyroid : no enlargement/tenderness/nodules  Back:     Symmetric, no curvature, ROM normal, no CVA tenderness  Lungs:     Clear to auscultation bilaterally, respirations unlabored  Chest Wall:    No tenderness or deformity   Heart:    Regular rate and rhythm, S1 and S2 normal, no murmur, rub   or gallop  Breast Exam:    Deferred to mammo  Abdomen:     Soft, non-tender, bowel sounds active all four quadrants,    no masses, no organomegaly  Genitalia:    Deferred  Rectal:    Extremities:   Extremities normal, atraumatic, no cyanosis or edema  Pulses:   2+ and symmetric all extremities  Skin:   Skin color, texture, turgor normal, no rashes or lesions  Lymph nodes:   Cervical, supraclavicular, and axillary nodes normal  Neurologic:   CNII-XII intact, normal strength, sensation and reflexes    throughout          Assessment & Plan:

## 2024-11-29 NOTE — Assessment & Plan Note (Signed)
 Pt's PE WNL.  UTD on colonoscopy, mammo, foot exam, microalbumin, and immunizations.  Has eye exam report at home and will send via Mychart.  Check labs.  Anticipatory guidance provided.

## 2024-11-30 ENCOUNTER — Encounter: Payer: Self-pay | Admitting: Family Medicine

## 2024-11-30 ENCOUNTER — Ambulatory Visit: Payer: Self-pay | Admitting: Family Medicine

## 2024-12-01 ENCOUNTER — Other Ambulatory Visit: Payer: Self-pay | Admitting: Family Medicine

## 2024-12-16 ENCOUNTER — Other Ambulatory Visit: Payer: Self-pay | Admitting: Family Medicine

## 2024-12-16 DIAGNOSIS — E785 Hyperlipidemia, unspecified: Secondary | ICD-10-CM

## 2024-12-19 ENCOUNTER — Encounter: Payer: Self-pay | Admitting: Emergency Medicine

## 2024-12-19 ENCOUNTER — Ambulatory Visit
Admission: EM | Admit: 2024-12-19 | Discharge: 2024-12-19 | Disposition: A | Discharge status: Home / Self Care | Attending: Student | Admitting: Student

## 2024-12-19 DIAGNOSIS — L731 Pseudofolliculitis barbae: Secondary | ICD-10-CM | POA: Diagnosis not present

## 2024-12-19 MED ORDER — MUPIROCIN 2 % EX OINT: 1.0000 | TOPICAL_OINTMENT | Freq: Two times a day (BID) | CUTANEOUS | 0 refills | Status: AC

## 2024-12-19 NOTE — ED Provider Notes (Signed)
 " [facility]    CSN: [id number] Arrival date & time: 12/19/24  1554      History   Chief Complaint Chief Complaint  Patient presents with   groing lump    HPI Abigail Ellis is a 69 y.o. female.  Pt c/o lump on right side of labia for 3 weeks. Denies pain, drainage or itching. Same thing happened many years ago and resolved on its own. Has not attempted home interventions.   HPI  Past Medical History:  Diagnosis Date   Anemia    Diabetes mellitus without complication (HCC)    diet controlled   Hyperlipidemia    Hypertension    Migraines     Patient Active Problem List   Diagnosis Date Noted   Elevated liver enzymes 05/14/2024   Family history of malignant neoplasm of colon 05/14/2024   Abnormal levels of other serum enzymes 11/28/2023   Acute anal fissure 04/07/2021   Diverticular disease of colon 04/07/2021   Family history of malignant neoplasm of gastrointestinal tract 04/07/2021   Second degree hemorrhoids 04/07/2021   Diet-controlled diabetes mellitus (HCC) 01/31/2018   Atrophic vaginitis 03/19/2015   Family history of heart disease 01/21/2015   OAB (overactive bladder) 05/02/2014   Constipation, slow transit 08/03/2013   Physical exam 01/06/2012   Hypertriglyceridemia 03/09/2011   SUPERFICIAL PHLEBITIS 10/03/2009   HYPOPOTASSEMIA 08/12/2009   Hyperlipidemia 06/24/2009   HYPERTENSION, BENIGN ESSENTIAL 09/18/2008    Past Surgical History:  Procedure Laterality Date   No previous surgery      OB History   No obstetric history on file.      Home Medications    Prior to Admission medications  Medication Sig Start Date End Date Taking? Authorizing Provider  mupirocin  ointment (BACTROBAN ) 2 % Apply 1 Application topically 2 (two) times daily for 7 days. 12/19/24 12/26/24 Yes Elva Mauro E, PA-C  ACCU-CHEK GUIDE TEST test strip USE METER TO TEST 2 TIMES DAILY PRIOR TO A MEAL OR 2 HOURS AFTER 11/13/24   Tabori, Katherine E, MD  amLODipine   (NORVASC ) 5 MG tablet TAKE 1 TABLET BY MOUTH DAILY 09/03/24   Tabori, Katherine E, MD  Aspirin 81 MG CAPS Aspirin 81 mg    [provider]  aspirin 81 MG tablet Take 81 mg by mouth daily.    [provider]  BIOTIN PO Take by mouth daily.    [provider]  COLLAGEN PO Take by mouth.    [provider]  ezetimibe  (ZETIA ) 10 MG tablet TAKE 1 TABLET BY MOUTH DAILY 12/17/24   Tabori, Katherine E, MD  fluticasone  (FLONASE ) 50 MCG/ACT nasal spray Place 2 sprays into both nostrils daily. 04/23/19 11/29/24  Tabori, Katherine E, MD  glucose blood test strip Use as instructed 10/18/22   Tabori, Katherine E, MD  hydrochlorothiazide  (MICROZIDE ) 12.5 MG capsule TAKE 1 CAPSULE BY MOUTH DAILY 08/22/24   Tabori, Katherine E, MD  losartan  (COZAAR ) 50 MG tablet Take 1 tablet (50 mg total) by mouth daily. 10/23/24   Tabori, Katherine E, MD  rosuvastatin  (CRESTOR ) 10 MG tablet TAKE 1 TABLET BY MOUTH DAILY 12/17/24   Tabori, Katherine E, MD  TOPROL  XL 100 MG 24 hr tablet TAKE 1 TABLET BY MOUTH DAILY  WITH OR IMMEDIATELY FOLLOWING A  MEAL 12/03/24   Tabori, Katherine E, MD  vitamin C (ASCORBIC ACID) 500 MG tablet Take 500 mg by mouth daily.    [provider]  Vitamin D , Ergocalciferol , (DRISDOL) 1.25 MG (50000 UNIT)  CAPS capsule Take 50,000 Units by mouth every 7 (seven) days.    [provider]    Family History Family History  Problem Relation Age of Onset   Hypertension Mother        Mitral valve prolapse   Heart disease Mother    Hypertension Father    Bladder Cancer Maternal Aunt    Colon cancer Maternal Uncle    Heart disease Brother        CAD; PCI at 72   Breast cancer Neg Hx    BRCA 1/2 Neg Hx     Social History Social History[1]   Allergies   Zocor [simvastatin], Fenofibrate , and Potassium-containing compounds   Review of Systems Review of Systems  Skin:        Groin cyst     Physical Exam Triage Vital Signs ED Triage Vitals   Encounter Vitals Group     BP 12/19/24 1603 (!) 151/93     Girls Systolic BP Percentile --      Girls Diastolic BP Percentile --      Boys Systolic BP Percentile --      Boys Diastolic BP Percentile --      Pulse Rate 12/19/24 1603 66     Resp 12/19/24 1603 16     Temp 12/19/24 1603 (!) 97.4 F (36.3 C)     Temp Source 12/19/24 1603 Oral     SpO2 12/19/24 1603 95 %     Weight --      Height --      Head Circumference --      Peak Flow --      Pain Score 12/19/24 1604 0     Pain Loc --      Pain Education --      Exclude from Growth Chart --    No data found.  Updated Vital Signs BP (!) 151/93 (BP Location: Right Arm)   Pulse 66   Temp (!) 97.4 F (36.3 C) (Oral)   Resp 16   SpO2 95%   Visual Acuity Right Eye Distance:   Left Eye Distance:   Bilateral Distance:    Right Eye Near:   Left Eye Near:    Bilateral Near:     Physical Exam Vitals reviewed.  Constitutional:      General: She is not in acute distress.    Appearance: Normal appearance. She is not ill-appearing.  HENT:     Head: Normocephalic and atraumatic.  Pulmonary:     Effort: Pulmonary effort is normal.  Genitourinary:     Comments: Chaperone: Kayla, RN There is a tiny 2 mm ingrown hair on the right labia majora.  There is no induration, fluctuance, warmth, erythema, or drainage. Neurological:     General: No focal deficit present.     Mental Status: She is alert and oriented to person, place, and time.  Psychiatric:        Mood and Affect: Mood normal.        Behavior: Behavior normal.        Thought Content: Thought content normal.        Judgment: Judgment normal.      UC Treatments / Results  Labs (all labs ordered are listed, but only abnormal results are displayed) Labs Reviewed - No data to display  EKG   Radiology No results found.  Procedures Procedures (including critical care time)  Medications Ordered in UC Medications - No data to display  Initial Impression /  Assessment and Plan / UC Course  I have reviewed the triage vital signs and the nursing notes.  Pertinent labs & imaging results that were available during my care of the patient were reviewed by me and considered in my medical decision making (see chart for details).     Patient is a pleasant 69 y.o. female presenting with ingrown hair-right labia majora. The patient is afebrile and nontachycardic.  Antipyretic has not been administered today.  The ingrown hair is very small, and noninfected.  Recommended daily sitz bath's.  Mupirocin  ointment sent.  Return precautions as below.  Final Clinical Impressions(s) / UC Diagnoses   Final diagnoses:  Ingrown hair     Discharge Instructions      -You have a small ingrown hair in the private area. -I recommend taking a warm bath daily for the next 7 days.  Avoid fragranced products -stick with boring, unscented products. -You can use the mupirocin  ointment 1-2 times daily on the area. --->Typically, ingrown hairs resolve on their own, but if your symptoms worsen or persist, follow-up with us  or your primary care provider     ED Prescriptions     Medication Sig Dispense Auth. Provider   mupirocin  ointment (BACTROBAN ) 2 % Apply 1 Application topically 2 (two) times daily for 7 days. 22 g Elvan Ebron E, PA-C      PDMP not reviewed this encounter.     [1]  Social History Tobacco Use   Smoking status: Former    Current packs/day: 0.25    Average packs/day: 0.3 packs/day for 10.1 years (2.5 ttl pk-yrs)    Types: Cigarettes    Start date: 11/29/2014   Smokeless tobacco: Never  Vaping Use   Vaping status: Never Used  Substance Use Topics   Alcohol use: No    Alcohol/week: 0.0 standard drinks of alcohol   Drug use: No     Arlyss Leita BRAVO, PA-C 12/19/24 1630  "

## 2024-12-19 NOTE — Discharge Instructions (Addendum)
-  You have a small ingrown hair in the private area. -I recommend taking a warm bath daily for the next 7 days.  Avoid fragranced products -stick with boring, unscented products. -You can use the mupirocin  ointment 1-2 times daily on the area. --->Typically, ingrown hairs resolve on their own, but if your symptoms worsen or persist, follow-up with us  or your primary care provider

## 2024-12-19 NOTE — ED Triage Notes (Signed)
 Pt c/o lump on right side of labia for 3 weeks.Denies pain or itching

## 2025-01-23 ENCOUNTER — Encounter: Admitting: Family Medicine

## 2025-05-30 ENCOUNTER — Ambulatory Visit: Admitting: Family Medicine
